# Patient Record
Sex: Male | Born: 1990 | Hispanic: Yes | Smoking: Never smoker
Health system: Southern US, Community
[De-identification: ages and names within clinical notes are randomized; demographics above are authoritative.]

## PROBLEM LIST (undated history)

## (undated) DIAGNOSIS — F419 Anxiety disorder, unspecified: Secondary | ICD-10-CM

## (undated) DIAGNOSIS — I10 Essential (primary) hypertension: Secondary | ICD-10-CM

## (undated) DIAGNOSIS — G43909 Migraine, unspecified, not intractable, without status migrainosus: Secondary | ICD-10-CM

## (undated) HISTORY — DX: Essential (primary) hypertension: I10

---

## 2013-01-29 ENCOUNTER — Encounter (HOSPITAL_COMMUNITY): Payer: Self-pay | Admitting: Emergency Medicine

## 2013-01-29 ENCOUNTER — Emergency Department (HOSPITAL_COMMUNITY)
Admission: EM | Admit: 2013-01-29 | Discharge: 2013-01-29 | Disposition: A | Discharge status: Home / Self Care | Payer: Self-pay | Attending: Emergency Medicine | Admitting: Emergency Medicine

## 2013-01-29 DIAGNOSIS — R42 Dizziness and giddiness: Secondary | ICD-10-CM | POA: Insufficient documentation

## 2013-01-29 LAB — URINALYSIS, ROUTINE W REFLEX MICROSCOPIC
Glucose, UA: NEGATIVE mg/dL
Hgb urine dipstick: NEGATIVE
Ketones, ur: NEGATIVE mg/dL
Leukocytes, UA: NEGATIVE
Protein, ur: NEGATIVE mg/dL
Specific Gravity, Urine: 1.011 (ref 1.005–1.030)
pH: 8 (ref 5.0–8.0)

## 2013-01-29 LAB — CBC WITH DIFFERENTIAL/PLATELET
Basophils Absolute: 0 10*3/uL (ref 0.0–0.1)
Basophils Relative: 0 % (ref 0–1)
Eosinophils Absolute: 0 10*3/uL (ref 0.0–0.7)
Eosinophils Relative: 0 % (ref 0–5)
HCT: 46.4 % (ref 39.0–52.0)
MCHC: 35.3 g/dL (ref 30.0–36.0)
MCV: 85.9 fL (ref 78.0–100.0)
Monocytes Absolute: 0.6 10*3/uL (ref 0.1–1.0)
RDW: 13 % (ref 11.5–15.5)

## 2013-01-29 LAB — COMPREHENSIVE METABOLIC PANEL
ALT: 25 U/L (ref 0–53)
AST: 24 U/L (ref 0–37)
Albumin: 4.3 g/dL (ref 3.5–5.2)
Alkaline Phosphatase: 79 U/L (ref 39–117)
Calcium: 10.2 mg/dL (ref 8.4–10.5)
Creatinine, Ser: 0.69 mg/dL (ref 0.50–1.35)
GFR calc non Af Amer: >90 mL/min (ref >90)
Potassium: 3.5 mEq/L (ref 3.5–5.1)
Total Protein: 7.8 g/dL (ref 6.0–8.3)

## 2013-01-29 MED ORDER — MECLIZINE HCL 50 MG PO TABS: 50.0000 mg | ORAL_TABLET | Freq: Three times a day (TID) | ORAL | Status: DC | PRN

## 2013-01-29 MED ORDER — ONDANSETRON 4 MG PO TBDP: 4.0000 mg | ORAL_TABLET | Freq: Three times a day (TID) | ORAL | Status: DC | PRN

## 2013-01-29 NOTE — ED Notes (Signed)
Pt c/o vomiting and right arm numbness x 2 days; pt denies pain; pt speaks no english

## 2013-01-29 NOTE — ED Notes (Signed)
Pt comfortable with d/c and f/u instructions. Interpreter phone used to discuss discharge and f/u instructions. Prescriptions x2.

## 2013-01-29 NOTE — ED Provider Notes (Signed)
CSN: [id number]     Arrival date & time 01/29/13  1059 History   First MD Initiated Contact with Patient 01/29/13 1205     Chief Complaint  Patient presents with  . Emesis   (Consider location/radiation/quality/duration/timing/severity/associated sxs/prior Treatment) HPI Comments: Patient is a 22 year old male with no past medical history who presents with a 2 day history of dizziness. Symptoms started gradually and remained intermittent. The dizziness is room spinning and worse with head movement. Patient reports recovering from an ear infection 1 week ago. Patient reports associated nausea and vomiting with the dizziness. Patient did not try anything for symptoms.    History reviewed. No pertinent past medical history. History reviewed. No pertinent past surgical history. History reviewed. No pertinent family history. History  Substance Use Topics  . Smoking status: Never Smoker   . Smokeless tobacco: Not on file  . Alcohol Use: No    Review of Systems  Gastrointestinal: Positive for nausea and vomiting.  Neurological: Positive for dizziness.  All other systems reviewed and are negative.    Allergies  Review of patient's allergies indicates no known allergies.  Home Medications  No current outpatient prescriptions on file. BP 125/64  Pulse 85  Temp(Src) 98.8 F (37.1 C) (Oral)  Resp 18  Wt 132 lb 9.6 oz (60.147 kg)  SpO2 100% Physical Exam  Nursing note and vitals reviewed. Constitutional: He is oriented to person, place, and time. He appears well-developed and well-nourished. No distress.  HENT:  Head: Normocephalic and atraumatic.  Right Ear: External ear normal.  Left Ear: External ear normal.  Nose: Nose normal.  Mouth/Throat: Oropharynx is clear and moist. No oropharyngeal exudate.  Bilateral TM visualized and intact without erythema or retraction noted.   Eyes: Conjunctivae and EOM are normal. Pupils are equal, round, and reactive to light. No scleral  icterus.  Neck: Normal range of motion.  Cardiovascular: Normal rate and regular rhythm.  Exam reveals no gallop and no friction rub.   No murmur heard. Pulmonary/Chest: Effort normal and breath sounds normal. He has no wheezes. He has no rales. He exhibits no tenderness.  Musculoskeletal: Normal range of motion.  Neurological: He is alert and oriented to person, place, and time. Coordination normal.  Speech is goal-oriented. Moves limbs without ataxia.   Skin: Skin is warm and dry.  Psychiatric: He has a normal mood and affect. His behavior is normal.    ED Course  Procedures (including critical care time) Labs Review Labs Reviewed  COMPREHENSIVE METABOLIC PANEL - Abnormal; Notable for the following:    Glucose, Bld 124 (*)    All other components within normal limits  URINALYSIS, ROUTINE W REFLEX MICROSCOPIC - Abnormal; Notable for the following:    APPearance CLOUDY (*)    All other components within normal limits  URINE CULTURE  CBC WITH DIFFERENTIAL  LIPASE, BLOOD   Imaging Review No results found.  EKG Interpretation   None       MDM   1. Vertigo     1:43 PM Patient will be treated for vertigo with Meclizine and zofran for nausea. Vitals stable and patient afebrile. Labs and urinalysis unremarkable for acute changes. No further evaluation needed at this time. Plan discussed with patient via nurse who speaks spanish fluently.     Emilia Beck, PA-C 01/29/13 1406

## 2013-01-30 LAB — URINE CULTURE

## 2013-01-30 NOTE — ED Provider Notes (Signed)
Medical screening examination/treatment/procedure(s) were performed by non-physician practitioner and as supervising physician I was immediately available for consultation/collaboration.   Deundra Furber M Avory Mimbs, DO 01/30/13 2204 

## 2013-09-09 ENCOUNTER — Encounter (HOSPITAL_COMMUNITY): Payer: Self-pay | Admitting: Emergency Medicine

## 2013-09-09 ENCOUNTER — Emergency Department (HOSPITAL_COMMUNITY)
Admission: EM | Admit: 2013-09-09 | Discharge: 2013-09-09 | Discharge status: Against Medical Advice | Payer: Self-pay | Attending: Emergency Medicine | Admitting: Emergency Medicine

## 2013-09-09 DIAGNOSIS — R51 Headache: Secondary | ICD-10-CM | POA: Insufficient documentation

## 2013-09-09 DIAGNOSIS — F17200 Nicotine dependence, unspecified, uncomplicated: Secondary | ICD-10-CM | POA: Insufficient documentation

## 2013-09-09 NOTE — ED Notes (Signed)
Pt c/o headache x 1 year that has been intermittent. 5/ 10 pain. Denies n/v.

## 2013-09-09 NOTE — ED Notes (Signed)
Pt ripped off arm bracelet and walked out and left.

## 2013-09-11 ENCOUNTER — Encounter (HOSPITAL_COMMUNITY): Payer: Self-pay | Admitting: Emergency Medicine

## 2013-09-11 ENCOUNTER — Emergency Department (HOSPITAL_COMMUNITY): Payer: Self-pay

## 2013-09-11 ENCOUNTER — Emergency Department (HOSPITAL_COMMUNITY)
Admission: EM | Admit: 2013-09-11 | Discharge: 2013-09-11 | Disposition: A | Discharge status: Home / Self Care | Payer: Self-pay | Attending: Emergency Medicine | Admitting: Emergency Medicine

## 2013-09-11 DIAGNOSIS — R42 Dizziness and giddiness: Secondary | ICD-10-CM | POA: Insufficient documentation

## 2013-09-11 DIAGNOSIS — H538 Other visual disturbances: Secondary | ICD-10-CM | POA: Insufficient documentation

## 2013-09-11 DIAGNOSIS — R519 Headache, unspecified: Secondary | ICD-10-CM

## 2013-09-11 DIAGNOSIS — R51 Headache: Secondary | ICD-10-CM | POA: Insufficient documentation

## 2013-09-11 MED ORDER — ASPIRIN-ACETAMINOPHEN-CAFFEINE 250-250-65 MG PO TABS: 1.0000 | ORAL_TABLET | Freq: Four times a day (QID) | ORAL | Status: DC | PRN

## 2013-09-11 MED ORDER — MECLIZINE HCL 25 MG PO TABS
25.0000 mg | ORAL_TABLET | Freq: Once | ORAL | Status: AC
  Administered 2013-09-11: 25 mg via ORAL
  Filled 2013-09-11: qty 1

## 2013-09-11 MED ORDER — DIPHENHYDRAMINE HCL 50 MG/ML IJ SOLN
25.0000 mg | Freq: Once | INTRAMUSCULAR | Status: AC
  Administered 2013-09-11: 25 mg via INTRAMUSCULAR
  Filled 2013-09-11: qty 1

## 2013-09-11 MED ORDER — METOCLOPRAMIDE HCL 5 MG/ML IJ SOLN
10.0000 mg | Freq: Once | INTRAMUSCULAR | Status: AC
  Administered 2013-09-11: 10 mg via INTRAMUSCULAR
  Filled 2013-09-11: qty 2

## 2013-09-11 NOTE — ED Notes (Signed)
Pt escorted to discharge window. Pt verbalized understanding discharge instructions. In no acute distress.  

## 2013-09-11 NOTE — Discharge Instructions (Signed)
Please follow up with your primary care physician in 1-2 days. If you do not have one please call the [facility] and wellness Center number listed above. Please follow up with [facility] to schedule a follow up appointment. Please take medications as prescribed. Please read all discharge instructions and return precautions.    Dolor de Training and development officer, preguntas frecuentes y sus respuestas (Headaches, Frequently Asked Questions) CEFALEAS MIGRAOSAS P: Qu es la migraa? Qu la ocasiona? Cmo puedo tratarla? R: En general, la migraa comienza como un dolor apagado. Luego progresa hacia un dolor, constante, punzante y como un latido. Sentir Scientist, physiological las sienes. Podr sentir Radiographer, therapeutic parte anterior o posterior de la cabeza, o en uno o ambos lados. El dolor suele estar acompaado de una combinacin de:  Nuseas.  Vmitos.  Sensibilidad a la luz y los ruidos. Algunas personas (un 15%) experimentan un aura (ver abajo) antes de un ataque. La causa de la migraa se debe a reacciones qumicas del cerebro. El tratamiento para la migraa puede incluir medicamentos de [place]. Tambin puede incluir tcnicas de [place]. Estas incluyen entrenamientos para la relajacin y biorretroalimentacin.  P: Qu es un aura? R: Alrededor del 15% de las personas con migraa tiene un "aura". Es una seal de sntomas neurolgicos que ocurren antes de un dolor de cabeza por migraas. Podr ver lneas onduladas o irregulares, puntos o luces parpadeantes. Podr experimentar visin de tnel o puntos ciegos en uno o ambos ojos. El aura puede incluir alucinaciones visuales o auditivas (algo que se imagina). Puede incluir trastornos en el olfato (como olores extraos), el tacto o el gusto. Entre otros sntomas se incluyen:  Adormecimiento.  Sensacin de hormigueo.  Dificultad para recordar o Occupational hygienist. Estos episodios neurolgicos pueden durar hasta 60 minutos. Los sntomas desaparecern a  medida que el dolor de cabeza comience. P:Qu es un disparador? R: Ciertos factores fsicos o Sports administrator a "disparar" una migraa. Estos son:  Alimentos.  Cambios hormonales.  Clima.  Estrs. Es importante recordar que los disparadores son diferentes entre si. Para ayudar a prevenir ataques de migraas, necesitar descubrir cules son los Psychologist, prison and probation services. Lleve un diario sobre sus dolores de [place]. Este es un buen modo para descubrir los disparadores. El Sales executive en el momento de hablar con el profesional acerca de su enfermedad. P: El clima afecta en las migraas? R: La luz solar, el calor, la humedad y lo cambios drsticos en la presin Software engineer a, o "disparar" un ataque de migraa en [employer]. Pero estudios han demostrado que el clima no acta como disparador para todas las personas con [place]. P: Cul es la relacin entre la migraa y la hormonas? R: Las hormonas inician y regulan muchas de las funciones corporales. Las hormonas [employer] balance en el cuerpo dentro de los constantes cambios de [place]. Algunas veces, el nivel de hormonas en el cuerpo se desbalancea. Por ejemplo, durante la menstruacin, el embarazo o la [place]. Pueden ser la causa de un ataque de migraa. De hecho, alrededor de tres cuartos de las mujeres con migraa informan que sus ataques estn relacionados con el ciclo menstrual.  P: Aumenta el riesgo de sufrir un choque cardaco en las personas que padecen migraa? R: La probabilidad de que un ataque de migraa ocasione un ataque cardaco es muy remota. Esto no quiere [employer] persona que sufre de migraa no pueda tener un ataque cardaco asociado con ella. En las  personas menores de 40 aos, el factor ms comn para un ataque es la [place]. Pero durante la vida de una persona, la ocurrencia de un dolor de cabeza por migraa est asociada con una reduccin en el riesgo de morir por un ataque  cerebrovascular.  P: Cules son los medicamentos para la migraa? R: La medicacin precisa se [place] para tratar el dolor de cabeza una vez que ha comenzado. Son ejemplos, medicamentos de [place], desinflamatorios sin esteroides, ergotamnicos y triptanos.  P: Qu son los triptanos? R: Lo triptanos son Yaakov Guthrie clase de medicamentos abortivos. Son especficos para tratar este problema. Los triptanos son vasoconstrictores. Moderan algunas reacciones qumicas del cerebro. Los triptanos trabajan como receptores del cerebro. Ayudan a Warehouse manager de un neurotransmisor denominado serotonina. Se cree que las fluctuaciones en los [place] de serotonina son la causa principal de la migraa.  P: Son [employer] de venta libre para la migraa? R: Los medicamentos de [employer] pueden ser efectivos para Paramedic dolores leves a moderados y los sntomas asociados a la [place]. Pero deber consultar a un mdico antes de Charity fundraiser tratamiento para la migraa.  P: Cules son los medicamentos de prevencin de la migraa? R: Se suele denominar tratamiento "profilctico" a los medicamentos para la prevencin de la migraa. Se utilizan para reducir Print production planner, gravedad y duracin de los ataques de [place]. Son ejemplos de medicamentos de prevencin: antiepilpticos, antidepresivos, bloqueadores beta, bloqueadores de los canales de calcio y medicamentos antiinflamatorios sin esteroides. P:  Por qu se utilizan anticonvulsivantes para tratar la migraa? R: Durante los ltimos aos, ha habido un creciente inters en las drogas antiepilpticas para la prevencin de la migraa. A menudo se los conoce como "anticonvulsivantes". La epilepsia y la migraa suceden por reacciones similares en el cerebro.  P:  Por qu se utilizan antidepresivos para tratar la migraa? R: Los antidepresivos tpicamente se [place] para tratar a las personas con depresin. Pueden reducir la frecuencia de la  migraa a travs de la regulacin de los niveles qumicos, como la serotonina, en el cerebro.  P:  Por qu se utilizan terapias alternativas para tratar la migraa? R: El trmino "terapias alternativas" suelen utilizarse para describir los tratamientos que se considera que estn por fuera de Occupational hygienist la medicina occidental convencional. Son ejemplos de las terapias alternativas: la acupuntura, la acupresin y el yoga. Otra terapia alternativa comn es la terapia herbal. Se cree que algunas hierbas ayudan a [employer] de [place]. Siempre consulte con [employer] acerca de las terapias alternativas antes de [place]. Algunos productos herbales contienen arsnico y [place] toxinas. DOLORES DE CABEZA POR TENSIN P: Qu es un dolor de cabeza por tensin? Qu lo ocasiona? Cmo puedo tratarlo? R: Los dolores de cabeza por tensin ocurren al azar. A menudo son el resultado de estrs temporario, ansiedad, fatiga o ira. Los sntomas Environmental education officer en las sienes, una sensacin como de tener una banda alrededor de la cabeza (un dolor que "presiona"). Los sntomas pueden incluir una sensacin de [place], de presin y Engineer, materials de los msculos de la cabeza y el cuello. El dolor comienza en la frente, sienes o en la parte posterior de la cabeza y el cuello. El tratamiento para los dolores de cabeza por tensin puede incluir medicamentos de [place]. Tambin puede incluir tcnicas de [place] con entrenamientos para la relajacin y biorretroalimentacin. CEFALEA EN RACIMOS P: Qu es una cefalea en racimos? Qu la ocasiona? Cmo puedo tratarla? R: La cefalea en racimos  toma su nombre debido a que los ataques vienen en grupos. El dolor aparece con poco o ningn aviso. Normalmente ocurre de un lado de la cabeza. Muchas veces el dolor viene acompaado de un lagrimeo u ojo rojo y goteo de la nariz del mismo lado que Chief Technology Officer. Se cree que la causa es una reaccin en las sustancias qumicas del cerebro. Se  describe como el caso ms grave e intenso de cualquier tipo de dolor de cabeza. El tratamiento incluye medicamentos bajo receta y oxgeno. CEFALEA SINUSAL P: Qu es una cefalea sinusal? Qu la ocasiona? Cmo puedo tratarla? R: Cuando se inflama una cavidad en los huesos de la cara y el crneo (sinus) ocasiona un dolor localizado. Esta enfermedad generalmente es el resultado de una reaccin alrgica, un tumor o una infeccin. Si el dolor de cabeza est ocasionado por un bloqueo del sinus, como una infeccin, probablemente tendr [place]. Una imagen de rayos X confirmar el bloqueo del sinus. El tratamiento indicado por el mdico podr incluir antibiticos para la infeccin, y tambin antihistamnicos o descongestivos.  DOLOR DE CABEZA POR EFECTO "REBOTE" P: Qu es un dolor de cabeza por efecto "rebote"? Qu lo ocasiona? Cmo puedo tratarlo? R: Si se toman medicamentos para el dolor de cabeza muy a menudo puede llevar a la enfermedad conocida como "dolor de cabeza por rebote". Un patrn de abuso de medicamentos para el dolor de cabeza supone tomarlos ms de [employer] por semana o en cantidades excesivas. Esto significa ms que lo que indica el envase o el mdico. Con los dolores de cabeza por rebote, los medicamentos no slo dejan de Engineer, materials sino que adems comienzan a Data processing manager dolores de [place]. Los mdicos tratan los dolores de cabeza por rebote mediante la disminucin del medicamento del que se ha abusado. A veces el medico podr sustituir gradualmente por un tipo diferente de tratamiento o medicacin. Dejar de consumirlo podra ser difcil. El abuso regular de un medicamento aumenta el potencial que se produzcan efectos secundarios graves. Consulte con un mdico si utiliza regularmente medicamentos para Chief Technology Officer de cabeza ms de [address] por semana o ms de lo que indica el envase. PREGUNTAS Y RESPUESTAS ADICIONALES P: Qu es la biorretroalimentacin? R: La biorretroalimentacin es un  tratamiento de [place]. La biorretroalimentacin utiliza un equipamiento especial para controlar los movimientos involuntarios del cuerpo y las respuestas fsicas. La biorretroalimentacin controla:  Respiracin.  Pulso.  Latidos cardacos.  Temperatura.  Tensin muscular.  Actividad cerebrales. La biorretroalimentacin le ayudar a mejorar y Production manager sus ejercicios de Microbiologist. Aprender a Chief Operating Officer las respuestas fsicas relacionadas con el estrs. Una vez que se dominan las tcnicas no necesitar ms el equipamiento. P: Son hereditarios los dolores de [place]? R: Segn algunas estimaciones, aproximadamente 28 millones de estadounidenses sufren migraa. Cuatro de cada cinco (80%) informan una historia familiar de migraa. Los investigadores no pueden asegurar si se trata de un problema gentico o Patent examiner. A pesar de esto, un nio tiene 50% de probabilidades de sufrir migraa si uno de sus padres la sufre. El nio tiene un 75% de probabilidades si ambos padres la sufren.  P. Puede un nio tener migraa? R: En el momento de ingresar a la escuela secundaria, la mayora de los jvenes han experimentado algn tipo de cefalea. Algunos abordajes o medicamentos seguros y Sports administrator las cefaleas o detenerlas luego de que han comenzado.  Olin Hauser tipo de especialista debe ver para diagnosticar y tratar una cefalea? R: Comience con  su mdico de cabecera. Huel Coteonverse acerca de su experiencia y abordaje de las cefaleas. Comente los mtodos de clasificacin, diagnstico y [place]viewtratamiento. El profesional decidir si lo derivar a Music therapistun especialista, segn los sntomas u otras enfermedades. El hecho de sufrir diabetes, Environmental consultantalergias, Catering manageretc, puede requerir un abordaje ms complejo. La National Headache Foundation (Fundacin Nacional para las Converseefaleas) Chief Technology Officerproporcionar, a pedido, Physiological scientistuna lista de los mdicos que son miembros de [place]. Document Released: 03/17/2008 Document Revised:  06/27/2011 [facility] Patient Information 2014 [place]ExitCare, [place].

## 2013-09-11 NOTE — ED Notes (Signed)
Pt c/o dizziness and headache for year now that come and go everyday. Pt has not seen a provider before for this issue. Pt states he does have blurred vision and pt states that he has seen a doctor and was prescribed glasses but pt doesn't wear them bc they hurt his eyes.

## 2013-09-11 NOTE — ED Notes (Signed)
Pt alert and oriented x4. Respirations even and unlabored, bilateral symmetrical rise and fall of chest. Skin warm and dry. In no acute distress. Denies needs.   

## 2013-09-11 NOTE — Progress Notes (Signed)
[facility] provided pt with a [facility] Orange Card application to help patient establish primary care.  °

## 2013-09-11 NOTE — ED Provider Notes (Signed)
CSN: [id number]     Arrival date & time 09/11/13  5929 History   First MD Initiated Contact with Patient 09/11/13 0901     Chief Complaint  Patient presents with  . Headache  . Dizziness     (Consider location/radiation/quality/duration/timing/severity/associated sxs/prior Treatment) HPI Comments: Patient is an otherwise healthy 23 yo M presenting to the ED for one year of left sided and posterior headaches with associated dizziness x 1 year. Patient states that the headaches come and go every day for the last year. He states he wakes up every morning with blurred vision that eventually resolves with time and then in the afternoon develops his headaches that he describes as throbbing pressure. Patient states the dizziness is constant. He has not seen a provider for this complaint, aside from an optometrist who prescribed glasses for the patient. Denies any fevers, chills, nausea, vomiting, photophobia, neck stiffness or pain, numbness or weakness.   Patient is a 23 y.o. male presenting with headaches and dizziness.  Headache Associated symptoms: dizziness   Associated symptoms: no fever, no numbness and no seizures   Dizziness Associated symptoms: headaches     History reviewed. No pertinent past medical history. History reviewed. No pertinent past surgical history. No family history on file. History  Substance Use Topics  . Smoking status: Never Smoker   . Smokeless tobacco: Not on file  . Alcohol Use: No    Review of Systems  Constitutional: Negative for fever and chills.  Neurological: Positive for dizziness and headaches. Negative for tremors, seizures, syncope, facial asymmetry, speech difficulty, weakness and numbness.  All other systems reviewed and are negative.     Allergies  Review of patient's allergies indicates no known allergies.  Home Medications   Prior to Admission medications   Medication Sig Start Date End Date Taking? Authorizing Provider  meclizine  (ANTIVERT) 50 MG tablet Take 1 tablet (50 mg total) by mouth 3 (three) times daily as needed. 01/29/13   Kaitlyn Szekalski, PA-C  ondansetron (ZOFRAN ODT) 4 MG disintegrating tablet Take 1 tablet (4 mg total) by mouth every 8 (eight) hours as needed for nausea. 01/29/13   Kaitlyn Szekalski, PA-C   BP 118/61  Pulse 71  Temp(Src) 97.8 F (36.6 C) (Oral)  Resp 16  SpO2 95% Physical Exam  Nursing note and vitals reviewed. Constitutional: He is oriented to person, place, and time. He appears well-developed and well-nourished. No distress.  HENT:  Head: Normocephalic and atraumatic.  Right Ear: Hearing, tympanic membrane, external ear and ear canal normal.  Left Ear: Hearing, tympanic membrane, external ear and ear canal normal.  Nose: Nose normal.  Mouth/Throat: Uvula is midline, oropharynx is clear and moist and mucous membranes are normal. No oropharyngeal exudate.  Eyes: Conjunctivae, EOM and lids are normal. Pupils are equal, round, and reactive to light.  Fundoscopic exam:      The right eye shows no exudate, no hemorrhage and no papilledema. The right eye shows red reflex.       The left eye shows no exudate, no hemorrhage and no papilledema. The left eye shows red reflex.  Neck: Normal range of motion. Neck supple.  Cardiovascular: Normal rate, regular rhythm, normal heart sounds and intact distal pulses.   Pulmonary/Chest: Effort normal and breath sounds normal. No respiratory distress.  Abdominal: Soft. There is no tenderness.  Musculoskeletal: He exhibits no edema.  Moves all extremities without ataxia.    Neurological: He is alert and oriented to person, place, and time. He  has normal strength. He displays no tremor. No cranial nerve deficit. He displays a negative Romberg sign. He displays no seizure activity. Gait normal. GCS eye subscore is 4. GCS verbal subscore is 5. GCS motor subscore is 6.  Sensation grossly intact.  No pronator drift.  Bilateral heel-knee-shin intact.   Skin: Skin is warm and dry. He is not diaphoretic.    ED Course  Procedures (including critical care time) Medications  meclizine (ANTIVERT) tablet 25 mg (25 mg Oral Given 09/11/13 0939)  metoCLOPramide (REGLAN) injection 10 mg (10 mg Intramuscular Given 09/11/13 1056)  diphenhydrAMINE (BENADRYL) injection 25 mg (25 mg Intramuscular Given 09/11/13 1056)    Labs Review Labs Reviewed - No data to display  Imaging Review Ct Head Wo Contrast  09/11/2013   CLINICAL DATA:  Dizziness and headache  EXAM: CT HEAD WITHOUT CONTRAST  TECHNIQUE: Contiguous axial images were obtained from the base of the skull through the vertex without intravenous contrast.  COMPARISON:  None.  FINDINGS: Ventricles are normal in size and configuration. No parenchymal masses or mass effect. No areas of abnormal parenchymal attenuation. No extra-axial masses or abnormal fluid collections. There is no intracranial hemorrhage.  Visualized sinuses and mastoid air cells are clear. No skull lesion.  IMPRESSION: Normal unenhanced CT scan of the brain.   Electronically Signed   By: Amie Portlandavid  Ormond M.D.   On: 09/11/2013 09:52     EKG Interpretation None      MDM   Final diagnoses:  Headache   Filed Vitals:   09/11/13 1137  BP: 118/61  Pulse: 71  Temp: 97.8 F (36.6 C)  Resp: 16    10:22 AM Patient endorses some improvement of his HA and dizziness after Antivert. Discussed negative CT scans with patient and that he will likely need neurology outpatient follow up for further evaluation of chronic headaches.   Afebrile, NAD, non-toxic appearing, AAOx4. Pt HA treated and improved while in ED.  Presentation is like pts typical HA and non concerning for [facility]AH, ICH, Meningitis, or temporal arteritis. Pt is afebrile with no focal neuro deficits, nuchal rigidity, or change in vision. No evidence of papilledema, or other intra ocular abnormality. No evidence of mass on CT scan or other intracranial abnormality. Low clinical  suspicion for subarachnoid hemorrhage, intracranial temporal arteritis. Discussed with patient that he will likely need to follow up with an outpatient neurologist to discuss his chronic headaches. Precautions discussed. Pt verbalizes understanding and is agreeable with plan to dc.     Jeannetta EllisJennifer L Saidy Ormand, PA-C 09/11/13 1504

## 2013-09-12 ENCOUNTER — Emergency Department (HOSPITAL_COMMUNITY)
Admission: EM | Admit: 2013-09-12 | Discharge: 2013-09-12 | Disposition: A | Discharge status: Home / Self Care | Payer: Self-pay | Attending: Emergency Medicine | Admitting: Emergency Medicine

## 2013-09-12 ENCOUNTER — Encounter (HOSPITAL_COMMUNITY): Payer: Self-pay | Admitting: Emergency Medicine

## 2013-09-12 ENCOUNTER — Other Ambulatory Visit: Payer: Self-pay

## 2013-09-12 DIAGNOSIS — Z79899 Other long term (current) drug therapy: Secondary | ICD-10-CM | POA: Insufficient documentation

## 2013-09-12 DIAGNOSIS — F17200 Nicotine dependence, unspecified, uncomplicated: Secondary | ICD-10-CM | POA: Insufficient documentation

## 2013-09-12 DIAGNOSIS — R51 Headache: Secondary | ICD-10-CM | POA: Insufficient documentation

## 2013-09-12 DIAGNOSIS — R42 Dizziness and giddiness: Secondary | ICD-10-CM | POA: Insufficient documentation

## 2013-09-12 DIAGNOSIS — R519 Headache, unspecified: Secondary | ICD-10-CM

## 2013-09-12 LAB — CBC WITH DIFFERENTIAL/PLATELET
Eosinophils Absolute: 0 10*3/uL (ref 0.0–0.7)
Eosinophils Relative: 0 % (ref 0–5)
HCT: 42.7 % (ref 39.0–52.0)
Hemoglobin: 15.2 g/dL (ref 13.0–17.0)
Lymphocytes Relative: 9 % — ABNORMAL LOW (ref 12–46)
Lymphs Abs: 1.4 10*3/uL (ref 0.7–4.0)
MCH: 30.5 pg (ref 26.0–34.0)
MCV: 85.7 fL (ref 78.0–100.0)
Monocytes Absolute: 1.1 10*3/uL — ABNORMAL HIGH (ref 0.1–1.0)
Neutrophils Relative %: 84 % — ABNORMAL HIGH (ref 43–77)
Platelets: 249 10*3/uL (ref 150–400)
RBC: 4.98 MIL/uL (ref 4.22–5.81)

## 2013-09-12 LAB — COMPREHENSIVE METABOLIC PANEL
ALT: 25 U/L (ref 0–53)
BUN: 18 mg/dL (ref 6–23)
CO2: 25 mEq/L (ref 19–32)
Calcium: 9.9 mg/dL (ref 8.4–10.5)
Creatinine, Ser: 0.78 mg/dL (ref 0.50–1.35)
GFR calc Af Amer: >90 mL/min (ref >90)
GFR calc non Af Amer: >90 mL/min (ref >90)
Glucose, Bld: 129 mg/dL — ABNORMAL HIGH (ref 70–99)
Potassium: 4 mEq/L (ref 3.7–5.3)
Sodium: 138 mEq/L (ref 137–147)
Total Protein: 7.4 g/dL (ref 6.0–8.3)

## 2013-09-12 LAB — RAPID URINE DRUG SCREEN, HOSP PERFORMED
Barbiturates: NOT DETECTED
Benzodiazepines: NOT DETECTED
Cocaine: NOT DETECTED
Opiates: NOT DETECTED
Tetrahydrocannabinol: NOT DETECTED

## 2013-09-12 LAB — URINALYSIS, ROUTINE W REFLEX MICROSCOPIC
Glucose, UA: NEGATIVE mg/dL
Leukocytes, UA: NEGATIVE
Nitrite: NEGATIVE
Protein, ur: NEGATIVE mg/dL
Specific Gravity, Urine: 1.01 (ref 1.005–1.030)
pH: 6.5 (ref 5.0–8.0)

## 2013-09-12 LAB — I-STAT TROPONIN, ED

## 2013-09-12 MED ORDER — KETOROLAC TROMETHAMINE 30 MG/ML IJ SOLN
30.0000 mg | Freq: Once | INTRAMUSCULAR | Status: AC
  Administered 2013-09-12: 30 mg via INTRAVENOUS
  Filled 2013-09-12: qty 1

## 2013-09-12 MED ORDER — PROCHLORPERAZINE EDISYLATE 5 MG/ML IJ SOLN
10.0000 mg | Freq: Once | INTRAMUSCULAR | Status: AC
  Administered 2013-09-12: 10 mg via INTRAVENOUS
  Filled 2013-09-12: qty 2

## 2013-09-12 MED ORDER — DIPHENHYDRAMINE HCL 50 MG/ML IJ SOLN
25.0000 mg | Freq: Once | INTRAMUSCULAR | Status: AC
  Administered 2013-09-12: 25 mg via INTRAVENOUS
  Filled 2013-09-12: qty 1

## 2013-09-12 MED ORDER — SODIUM CHLORIDE 0.9 % IV BOLUS (SEPSIS)
1000.0000 mL | Freq: Once | INTRAVENOUS | Status: AC
  Administered 2013-09-12: 1000 mL via INTRAVENOUS

## 2013-09-12 MED ORDER — LORAZEPAM 2 MG/ML IJ SOLN
1.0000 mg | Freq: Once | INTRAMUSCULAR | Status: AC
  Administered 2013-09-12: 1 mg via INTRAVENOUS
  Filled 2013-09-12: qty 1

## 2013-09-12 NOTE — ED Provider Notes (Signed)
Medical screening examination/treatment/procedure(s) were performed by non-physician practitioner and as supervising physician I was immediately available for consultation/collaboration.   EKG Interpretation None       Millard Bautch, MD 09/12/13 0705 

## 2013-09-12 NOTE — ED Notes (Signed)
Burna Mortimer at bedside discussing follow up appt with neurologist.

## 2013-09-12 NOTE — Discharge Instructions (Signed)
Continue taking excedrin for headache.  Stop taking reglan.   Take motrin for headache as well.   See wellness center and [facility].   Return to ER if you have severe headache, worse dizziness, weakness.

## 2013-09-12 NOTE — ED Notes (Signed)
Pt A&OX4, ambulatory at discharge with slow, steady gait, NAD. 

## 2013-09-12 NOTE — ED Provider Notes (Addendum)
CSN: [id number]     Arrival date & time 09/12/13  1502 History   First MD Initiated Contact with Patient 09/12/13 1545     Chief Complaint  Patient presents with  . Headache  . Numbness     (Consider location/radiation/quality/duration/timing/severity/associated sxs/prior Treatment) The history is provided by the patient. A language interpreter was used.  Howard Bender is a 23 y.o. male here with migraines. He has been having intermittent headache over the last year. It started when he used cocaine a year ago. Claims that he hasn't been using cocaine recently. He was seen in the ER yesterday for headache and had a normal CT head. He was given meclizine, Reglan and felt better. He went home and took 2 doses of reglan this morning and then had worse dizziness, tingling sensation to face and arm.    History reviewed. No pertinent past medical history. History reviewed. No pertinent past surgical history. History reviewed. No pertinent family history. History  Substance Use Topics  . Smoking status: Current Every Day Smoker    Types: Cigarettes  . Smokeless tobacco: Not on file  . Alcohol Use: No    Review of Systems  Neurological: Positive for headaches.      Allergies  Review of patient's allergies indicates no known allergies.  Home Medications   Prior to Admission medications   Medication Sig Start Date End Date Taking? Authorizing Provider  amoxicillin (AMOXIL) 500 MG capsule Take 1,000 mg by mouth 2 (two) times daily. Prescribed on 08-19-13 however patient states that he just finished on 09-09-13   Yes Historical Provider, MD  aspirin-acetaminophen-caffeine (EXCEDRIN MIGRAINE) [phone] MG per tablet Take 1 tablet by mouth every 6 (six) hours as needed for headache.   Yes Historical Provider, MD  aspirin-acetaminophen-caffeine (EXCEDRIN MIGRAINE) [phone] MG per tablet Take 2 tablets by mouth every 6 (six) hours as needed for headache.   Yes Historical Provider, MD   clarithromycin (BIAXIN) 500 MG tablet Take 500 mg by mouth 2 (two) times daily. Prescribed 08-19-13 for 10 days however patient finished on 09-09-13   Yes Historical Provider, MD  meclizine (ANTIVERT) 25 MG tablet Take 25 mg by mouth 3 (three) times daily as needed for dizziness.   Yes Historical Provider, MD  omeprazole (PRILOSEC) 20 MG capsule Take 20 mg by mouth daily.   Yes Historical Provider, MD   BP 109/50  Pulse 69  Temp(Src) 98.7 F (37.1 C) (Oral)  Resp 19  SpO2 99% Physical Exam  Nursing note and vitals reviewed. Constitutional: He is oriented to person, place, and time. He appears well-nourished.  Anxious   HENT:  Head: Normocephalic.  Mouth/Throat: Oropharynx is clear and moist.  Eyes: Conjunctivae and EOM are normal. Pupils are equal, round, and reactive to light.  No nystagmus   Neck: Normal range of motion. Neck supple.  Cardiovascular: Normal rate, regular rhythm and normal heart sounds.   Pulmonary/Chest: Effort normal and breath sounds normal. No respiratory distress. He has no wheezes. He has no rales.  Abdominal: Bowel sounds are normal. He exhibits no distension. There is no tenderness. There is no rebound and no guarding.  Musculoskeletal: Normal range of motion.  Neurological: He is alert and oriented to person, place, and time. No cranial nerve deficit. Coordination normal.  Cn 2-12 intact, nl finger to nose. Nl gait. No pronator drift   Skin: Skin is warm and dry.  Psychiatric: He has a normal mood and affect. His behavior is normal. Judgment and thought content normal.  ED Course  Procedures (including critical care time) Labs Review Labs Reviewed  CBC WITH DIFFERENTIAL - Abnormal; Notable for the following:    WBC 15.3 (*)    Neutrophils Relative % 84 (*)    Neutro Abs 12.8 (*)    Lymphocytes Relative 9 (*)    Monocytes Absolute 1.1 (*)    All other components within normal limits  COMPREHENSIVE METABOLIC PANEL - Abnormal; Notable for the  following:    Glucose, Bld 129 (*)    All other components within normal limits  URINE RAPID DRUG SCREEN (HOSP PERFORMED)  URINALYSIS, ROUTINE W REFLEX MICROSCOPIC  I-STAT TROPOININ, ED    Imaging Review No results found.   EKG Interpretation   Date/Time:  Thursday Sep 12 2013 16:12:02 EDT Ventricular Rate:  85 PR Interval:  144 QRS Duration: 88 QT Interval:  328 QTC Calculation: 390 R Axis:   87 Text Interpretation:  Sinus rhythm No significant change since last  tracing Confirmed by Seneca Hoback  MD, Khaiden Segreto ([id number]) on 09/12/2013 4:15:27 PM      Date: 09/12/2013  Rate: 85  Rhythm: normal sinus rhythm  QRS Axis: normal  Intervals: normal  ST/T Wave abnormalities: normal  Conduction Disutrbances:none  Narrative Interpretation:   Old EKG Reviewed: none available    MDM   Final diagnoses:  None   Howard Bender is a 23 y.o. male here with chronic dizziness, headache. CT head yesterday showed no bleed or mass. Tachycardic 140s in triage but was 90s in the room. He appears anxious. I reassured him with the translator phone. I told him that since he is not feeling better with excedrin and reglan, he should see a neurologist. Will give headache cocktail and reassess.   7:50 PM Felt better with headache cocktail. Utox neg for cocaine. Tachycardia improved. Likely migraines. WBC slightly elevated but no signs of sepsis. Recommend continue excedrin and neuro f/u.    Howard Canalavid H Emelly Wurtz, MD 09/12/13 1950  Howard Canalavid H Lynsey Ange, MD 09/12/13 [phone]

## 2013-09-12 NOTE — Progress Notes (Signed)
ED CM consulted by Dr. Darl Householder concerning f/u care.Pt presented to [facility] ED with worsening headache patient was also seen yesterday at [facility] ED for the same complaint. Pt is Spanish speaking only [employer] Mickel Baas [phone]. Met with patient at bedside interpreter on speaker phone. Information confirmed via interpreter. Pt  States, that he does not have a PCP or Medical coverage.  Discussed the [facility] and it's services. Pt states,he is agreeable. [facility] brochure given with address and phone number. Explained that I would have a Spanish scheduler contact patient Friday 5/29, or Monday 6/1 to schedule appt for f/u at the [facility].

## 2013-09-12 NOTE — ED Notes (Signed)
Used translator phone: Pt very anxious at triage, reports having migraines x 1 year, was started on a new medication yesterday for migraines but doesn't know the name of it. Reports having a headache since one year ago when he was doing large amounts of cocaine. Took the medication today and began feeling "funny" having anxiety, tingling sensation to face and arms, tightening and contractures to fingers. Instructed pt to slow his breathing down. HR 140 at triage, ekg done.

## 2013-09-12 NOTE — ED Notes (Signed)
[id number] interpreter ID number - discharge instructions reviewed via interpreter phone.

## 2013-09-13 ENCOUNTER — Encounter (HOSPITAL_COMMUNITY): Payer: Self-pay | Admitting: Emergency Medicine

## 2013-10-04 ENCOUNTER — Ambulatory Visit: Payer: Self-pay

## 2013-11-27 ENCOUNTER — Encounter: Payer: Self-pay | Admitting: Neurology

## 2013-11-27 ENCOUNTER — Ambulatory Visit (INDEPENDENT_AMBULATORY_CARE_PROVIDER_SITE_OTHER): Payer: Self-pay | Admitting: Neurology

## 2013-11-27 VITALS — BP 120/84 | HR 87 | Ht 62.99 in | Wt 127.1 lb

## 2013-11-27 DIAGNOSIS — R51 Headache: Secondary | ICD-10-CM

## 2013-11-27 MED ORDER — NORTRIPTYLINE HCL 10 MG PO CAPS: ORAL_CAPSULE | ORAL | Status: DC

## 2013-11-27 NOTE — Patient Instructions (Signed)
1. Start nortriptyline 10mg : Take 1 tablet at bedtime for 1 week, then increase to 2 tablets at bedtime and continue 2. Follow-up in 2 months

## 2013-11-27 NOTE — Progress Notes (Signed)
NEUROLOGY CONSULTATION NOTE  Howard Bender MRN: [id number] DOB: [birth year]  Referring provider: Dr. Terrace Arabia Primary care provider: Dr. Terrace Arabia  Reason for consult:  Eye pain, dizziness, vision changes  Dear Dr Lonzo Cloud:  Thank you for your kind referral of Howard Bender for consultation of the above symptoms. Although his history is well known to you, please allow me to reiterate it for the purpose of our medical record. A Spanish medical interpreter was present to help with translation. The patient was accompanied to the clinic by his friend who also provides collateral information. Records and images were personally reviewed where available.  HISTORY OF PRESENT ILLNESS: This is a pleasant 23 year old right-handed Hispanic man with no significant past medical history in his usually state of health until the past year when he started having left temporal, bilateral retro-orbital and periorbital (above the eyelids) sharp pain with associated photosensitivity.  He reports that pain usually occurs when he walks around, he feels better sitting down and after eating.  He feels that symptoms started when he began using his cellphone, with his head bent forward. Extending his head back seemed to help.  He reports pain occurs daily, with rarely a day without pain. 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 When the pain first started, he was having associated nausea, which was relieved with Zofran. He has stopped taking this.  He also started having dizziness described as a brief spinning sensation with head movement, resolving when head is still.  He also had black floaters in his vision.  He had seen an optometrist twice and was told vision was okay. He had seen ENT, evaluation unremarkable, and symptoms felt to be due to migraine. He took 2 tablets of Excedrin migraine one time, however had side  effects of whole body numbness, leading to an visit to [facility] ER last 08/2013 where head CT done which I personally reviewed was unremarkable.  He denies any focal numbness/tingling/weaknss, no diplopia, dysarthria, dysphagia, neck/back pain, bowel/bladder dysfunction.  He denies any ear pain or tinnitus.  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 There is no family history of headaches.  Laboratory Data: Lab Results  Component Value Date   WBC 8.5 01/29/2013   HGB 16.4 01/29/2013   HCT 46.4 01/29/2013   MCV 85.9 01/29/2013   PLT 276 01/29/2013     Chemistry      Component Value Date/Time   NA 136 01/29/2013 1119   K 3.5 01/29/2013 1119   CL 99 01/29/2013 1119   CO2 24 01/29/2013 1119   BUN 11 01/29/2013 1119   CREATININE 0.69 01/29/2013 1119      Component Value Date/Time   CALCIUM 10.2 01/29/2013 1119   ALKPHOS 79 01/29/2013 1119   AST 24 01/29/2013 1119   ALT 25 01/29/2013 1119   BILITOT 0.8 01/29/2013 1119      PAST MEDICAL HISTORY: No past medical history on file.  PAST SURGICAL HISTORY: No past surgical history on file.  MEDICATIONS: Current Outpatient Prescriptions on File Prior to Visit  Medication Sig Dispense Refill  . aspirin-acetaminophen-caffeine (EXCEDRIN MIGRAINE) 250-250-65 MG per tablet Take 1 tablet by mouth every 6 (six) hours as needed for headache.  30 tablet  0  . loratadine (CLARITIN) 10 MG tablet Take 10 mg by mouth daily.      Marland Kitchen  meclizine (ANTIVERT) 25 MG tablet Take 25 mg by mouth 2 (two) times daily.      Marland Kitchen omeprazole (PRILOSEC) 20 MG capsule Take 20 mg by mouth 2 (two) times daily.      . ondansetron (ZOFRAN ODT) 4 MG disintegrating tablet Take 1 tablet (4 mg total) by mouth every 8 (eight) hours as needed for nausea.  10 tablet  0   No current facility-administered medications on file prior to visit.    ALLERGIES: No Known Allergies  FAMILY HISTORY: Family History  Problem Relation Age of Onset  .  Diabetes Mother   . High blood pressure Mother   . Cancer Maternal Grandmother   . Prostate cancer Maternal Grandfather     SOCIAL HISTORY: History   Social History  . Marital Status: Married    Spouse Name: N/A    Number of Children: 1  . Years of Education: N/A   Occupational History  . cook    Social History Main Topics  . Smoking status: Never Smoker   . Smokeless tobacco: Never Used  . Alcohol Use: No  . Drug Use: No  . Sexual Activity: Not on file   Other Topics Concern  . Not on file   Social History Narrative   ** Merged History Encounter **        REVIEW OF SYSTEMS: Constitutional: No fevers, chills, or sweats, no generalized fatigue, change in appetite Eyes: as above Ear, nose and throat: No hearing loss, ear pain, nasal congestion, sore throat Cardiovascular: No chest pain, palpitations Respiratory:  No shortness of breath at rest or with exertion, wheezes GastrointestinaI: No nausea, vomiting, diarrhea, abdominal pain, fecal incontinence Genitourinary:  No dysuria, urinary retention or frequency Musculoskeletal:  No neck pain, back pain Integumentary: No rash, pruritus, skin lesions Neurological: as above Psychiatric: No depression, insomnia, anxiety Endocrine: No palpitations, fatigue, diaphoresis, mood swings, change in appetite, change in weight, increased thirst Hematologic/Lymphatic:  No anemia, purpura, petechiae. Allergic/Immunologic: no itchy/runny eyes, nasal congestion, recent allergic reactions, rashes  PHYSICAL EXAM: Filed Vitals:   11/27/13 1325  BP: 120/84  Pulse: 87   General: No acute distress Head:  Normocephalic/atraumatic Eyes: Fundoscopic exam shows bilateral sharp discs, no vessel changes, exudates, or hemorrhages Neck: supple, no paraspinal tenderness, full range of motion Back: No paraspinal tenderness Heart: regular rate and rhythm Lungs: Clear to auscultation bilaterally. Vascular: No carotid bruits. Skin/Extremities:  No rash, no edema Neurological Exam: Mental status: alert and oriented to person, place, and time, no dysarthria or aphasia, Fund of knowledge is appropriate.  Recent and remote memory are intact.  Attention and concentration are normal.    Able to name objects and repeat phrases. Cranial nerves: CN I: not tested CN II: pupils equal, round and reactive to light, visual fields intact, fundi unremarkable. CN III, IV, VI:  full range of motion, no nystagmus, no ptosis CN V: facial sensation intact CN VII: upper and lower face symmetric CN VIII: hearing intact to finger rub CN IX, X: gag intact, uvula midline CN XI: sternocleidomastoid and trapezius muscles intact CN XII: tongue midline Bulk & Tone: normal, no fasciculations. Motor: 5/5 throughout with no pronator drift. Sensation: intact to light touch, cold, pin, vibration and joint position sense.  No extinction to double simultaneous stimulation.  Romberg test negative Deep Tendon Reflexes: +2 throughout, no ankle clonus Plantar responses: downgoing bilaterally Cerebellar: no incoordination on finger to nose, heel to shin. No dysdiadochokinesia Gait: narrow-based and steady, able to tandem walk adequately.  Tremor: none  IMPRESSION: This is a pleasant 23 year old right-handed man with a history of chronic daily headaches for the past year.  Headaches are over the orbital and left temporal regions, with some photosensitivity and initially nausea, as well as positional dizziness.  His neurological exam and head CT without contrast are normal.  There are some migrainous features to his headaches, he will start a daily headache prophylactic medication, nortriptyline to hopefully reduce the frequency and intensity of headaches. He does not take any over the counter medications. Side effects of nortriptyline were discussed, he will start on low dose with uptitration as tolerated.  He will follow-up in 2 months and knows to call our office for any  problems.  Thank you for allowing me to participate in the care of this patient. Please do not hesitate to call for any questions or concerns.   Patrcia DollyKaren Aquino, M.D.  CC: Dr. Lonzo CloudShamleffer

## 2013-11-28 ENCOUNTER — Encounter (HOSPITAL_COMMUNITY): Payer: Self-pay | Admitting: Emergency Medicine

## 2013-12-01 ENCOUNTER — Encounter: Payer: Self-pay | Admitting: Neurology

## 2013-12-01 DIAGNOSIS — R51 Headache: Secondary | ICD-10-CM | POA: Insufficient documentation

## 2013-12-01 DIAGNOSIS — R519 Headache, unspecified: Secondary | ICD-10-CM | POA: Insufficient documentation

## 2014-01-27 ENCOUNTER — Ambulatory Visit (INDEPENDENT_AMBULATORY_CARE_PROVIDER_SITE_OTHER): Payer: Self-pay | Admitting: Neurology

## 2014-01-27 ENCOUNTER — Encounter: Payer: Self-pay | Admitting: Neurology

## 2014-01-27 VITALS — BP 122/66 | HR 80 | Ht 63.5 in | Wt 129.0 lb

## 2014-01-27 DIAGNOSIS — G43009 Migraine without aura, not intractable, without status migrainosus: Secondary | ICD-10-CM

## 2014-01-27 MED ORDER — NORTRIPTYLINE HCL 10 MG PO CAPS
ORAL_CAPSULE | ORAL | Status: DC
Start: 2014-01-27 — End: 2014-04-03

## 2014-01-27 NOTE — Progress Notes (Signed)
NEUROLOGY FOLLOW UP OFFICE NOTE  Howard Bender [id number]  HISTORY OF PRESENT ILLNESS: I had the pleasure of seeing Howard Bender in follow-up in the neurology clinic on 01/27/2014.  The patient was last seen 2 months ago and is again accompanied by his friend today. Spanish language line was used for translation. On his last visit, he was reporting headaches occurring on a daily basis. Head CT unremarkable. He started nortriptyline, currently at 20mg  qhs with significant improvement in headaches. He states he feels a lot better, his vision is no longer blurry. On his initial visit, pain was a 10/10, now down to a 3 or 4, and only occurring if he watches TV, uses his phone, or stays out in the sun for prolonged periods. He would wake up with a stabbing pain that decreases after 3-4 days of reducing TV time. There is no nausea, vomiting, focal symptoms. Headaches do not affect work. He is tolerating nortriptyline 20mg  qhs with no side effects.  HPI:  This is a pleasant 23 yo RH man with no significant past medical history in his usually state of health until the past year when he started having left temporal, bilateral retro-orbital and periorbital (above the eyelids) sharp pain with associated photosensitivity. He reports that pain usually occurs when he walks around, he feels better sitting down and after eating. He feels that symptoms started when he began using his cellphone, with his head bent forward. Extending his head back seemed to help. He reported pain occurs daily, with rarely a day without pain. 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 When the pain first started, he was having associated nausea, which was relieved with Zofran. He has stopped taking this. He also started having dizziness described as a brief spinning sensation with head movement, resolving when head is still. He also had black floaters in his  vision. He had seen an optometrist twice and was told vision was okay. He had seen ENT, evaluation unremarkable, and symptoms felt to be due to migraine. He took 2 tablets of Excedrin migraine one time, however had side effects of whole body numbness, leading to an [facility] last 08/2013 where head CT done which I personally reviewed was unremarkable.   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 There is no family history of headaches.  PAST MEDICAL HISTORY: No past medical history on file.  MEDICATIONS: Current Outpatient Prescriptions on File Prior to Visit  Medication Sig Dispense Refill  .      .      .      . Nortriptyline 10mg    2 capsules at bedtime  60 capsule  3  .      .       No current facility-administered medications on file prior to visit.    ALLERGIES: No Known Allergies  FAMILY HISTORY: Family History  Problem Relation Age of Onset  . Diabetes Mother   . High blood pressure Mother   . Cancer Maternal Grandmother   . Prostate cancer Maternal Grandfather     SOCIAL HISTORY: History   Social History  . Marital Status: Married    Spouse Name: N/A    Number of Children: 1  . Years of Education: N/A   Occupational History  . cook    Social History Main Topics  . Smoking  status: Never Smoker   . Smokeless tobacco: Never Used  . Alcohol Use: No  . Drug Use: No  . Sexual Activity: Not on file   Other Topics Concern  . Not on file   Social History Narrative   ** Merged History Encounter **        REVIEW OF SYSTEMS: Constitutional: No fevers, chills, or sweats, no generalized fatigue, change in appetite Eyes: No visual changes, double vision, eye pain Ear, nose and throat: No hearing loss, ear pain, nasal congestion, sore throat Cardiovascular: No chest pain, palpitations Respiratory:  No shortness of breath at rest or with exertion, wheezes GastrointestinaI: No nausea, vomiting, diarrhea, abdominal pain,  fecal incontinence Genitourinary:  No dysuria, urinary retention or frequency Musculoskeletal:  No neck pain, back pain Integumentary: No rash, pruritus, skin lesions Neurological: as above Psychiatric: No depression, insomnia, anxiety Endocrine: No palpitations, fatigue, diaphoresis, mood swings, change in appetite, change in weight, increased thirst Hematologic/Lymphatic:  No anemia, purpura, petechiae. Allergic/Immunologic: no itchy/runny eyes, nasal congestion, recent allergic reactions, rashes  PHYSICAL EXAM: Filed Vitals:   01/27/14 0751  BP: 122/66  Pulse: 80   General: No acute distress Head:  Normocephalic/atraumatic Neck: supple, no paraspinal tenderness, full range of motion Heart:  Regular rate and rhythm Lungs:  Clear to auscultation bilaterally Back: No paraspinal tenderness Skin/Extremities: No rash, no edema Neurological Exam: alert and oriented to person, place, and time. No aphasia or dysarthria. Fund of knowledge is appropriate.  Recent and remote memory are intact.  Attention and concentration are normal.    Able to name objects and repeat phrases. Cranial nerves: Pupils equal, round, reactive to light.  Fundoscopic exam unremarkable, no papilledema. Extraocular movements intact with no nystagmus. Visual fields full. Facial sensation intact. No facial asymmetry. Tongue, uvula, palate midline.  Motor: Bulk and tone normal, muscle strength 5/5 throughout with no pronator drift.  Sensation to light touch, temperature and vibration intact.  No extinction to double simultaneous stimulation.  Deep tendon reflexes 2+ throughout, toes downgoing.  Finger to nose testing intact.  Gait narrow-based and steady, able to tandem walk adequately.  Romberg negative.  IMPRESSION: This is a pleasant 23 yo RH man who presented with chronic daily headaches for the past year. Headaches are over the orbital and left temporal regions,with some migrainous features.  Neurological exam and head CT  normal. He has had good response to nortriptyline 20mg  qhs and will increase dose to 30mg  qhs. He does not take any over the counter medications. He will follow-up in 2-3 months and knows to call our office for any problems.   Thank you for allowing me to participate in his care.  Please do not hesitate to call for any questions or concerns.  The duration of this appointment visit was 15 minutes of face-to-face time with the patient.  Greater than 50% of this time was spent in counseling, explanation of diagnosis, planning of further management, and coordination of care.   Patrcia DollyKaren Aquino, M.D.   CC: Dr. Lonzo CloudShamleffer

## 2014-01-27 NOTE — Patient Instructions (Signed)
1. Increase nortriptyline 10mg : Take 3 caps at bedtime 2. Follow-up in 2 months

## 2014-03-26 ENCOUNTER — Emergency Department (HOSPITAL_COMMUNITY)
Admission: EM | Admit: 2014-03-26 | Discharge: 2014-03-26 | Disposition: A | Discharge status: Home / Self Care | Payer: Self-pay | Attending: Emergency Medicine | Admitting: Emergency Medicine

## 2014-03-26 ENCOUNTER — Emergency Department (HOSPITAL_COMMUNITY)
Admission: EM | Admit: 2014-03-26 | Discharge: 2014-03-27 | Disposition: A | Discharge status: Home / Self Care | Payer: Self-pay | Attending: Emergency Medicine | Admitting: Emergency Medicine

## 2014-03-26 ENCOUNTER — Encounter (HOSPITAL_COMMUNITY): Payer: Self-pay | Admitting: Adult Health

## 2014-03-26 ENCOUNTER — Encounter (HOSPITAL_COMMUNITY): Payer: Self-pay | Admitting: *Deleted

## 2014-03-26 DIAGNOSIS — F419 Anxiety disorder, unspecified: Secondary | ICD-10-CM | POA: Insufficient documentation

## 2014-03-26 DIAGNOSIS — Z79899 Other long term (current) drug therapy: Secondary | ICD-10-CM | POA: Insufficient documentation

## 2014-03-26 DIAGNOSIS — R531 Weakness: Secondary | ICD-10-CM | POA: Insufficient documentation

## 2014-03-26 DIAGNOSIS — R110 Nausea: Secondary | ICD-10-CM | POA: Insufficient documentation

## 2014-03-26 DIAGNOSIS — T43615A Adverse effect of caffeine, initial encounter: Secondary | ICD-10-CM | POA: Insufficient documentation

## 2014-03-26 DIAGNOSIS — R200 Anesthesia of skin: Secondary | ICD-10-CM | POA: Insufficient documentation

## 2014-03-26 LAB — I-STAT CHEM 8, ED
BUN: 13 mg/dL (ref 6–23)
Calcium, Ion: 1.1 mmol/L — ABNORMAL LOW (ref 1.12–1.23)
Chloride: 104 mEq/L (ref 96–112)
Creatinine, Ser: 0.9 mg/dL (ref 0.50–1.35)
Glucose, Bld: 117 mg/dL — ABNORMAL HIGH (ref 70–99)
HCT: 46 % (ref 39.0–52.0)
Hemoglobin: 15.6 g/dL (ref 13.0–17.0)
Potassium: 4.8 mEq/L (ref 3.7–5.3)
Sodium: 140 mEq/L (ref 137–147)
TCO2: 24 mmol/L (ref 0–100)

## 2014-03-26 MED ORDER — SODIUM CHLORIDE 0.9 % IV BOLUS (SEPSIS)
1000.0000 mL | Freq: Once | INTRAVENOUS | Status: AC
  Administered 2014-03-26: 1000 mL via INTRAVENOUS

## 2014-03-26 MED ORDER — DIPHENHYDRAMINE HCL 50 MG/ML IJ SOLN
25.0000 mg | Freq: Once | INTRAMUSCULAR | Status: AC
  Administered 2014-03-26: 25 mg via INTRAVENOUS

## 2014-03-26 MED ORDER — DIPHENHYDRAMINE HCL 50 MG/ML IJ SOLN
INTRAMUSCULAR | Status: AC
  Filled 2014-03-26: qty 1

## 2014-03-26 MED ORDER — LORAZEPAM 2 MG/ML IJ SOLN
1.0000 mg | Freq: Once | INTRAMUSCULAR | Status: AC
Start: 2014-03-26 — End: 2014-03-26
  Administered 2014-03-26: 1 mg via INTRAVENOUS
  Filled 2014-03-26: qty 1

## 2014-03-26 MED ORDER — ONDANSETRON HCL 4 MG/2ML IJ SOLN
4.0000 mg | Freq: Once | INTRAMUSCULAR | Status: AC
  Administered 2014-03-26: 4 mg via INTRAVENOUS
  Filled 2014-03-26: qty 2

## 2014-03-26 MED ORDER — SODIUM CHLORIDE 0.9 % IV BOLUS (SEPSIS)
1000.0000 mL | INTRAVENOUS | Status: AC
  Administered 2014-03-26: 1000 mL via INTRAVENOUS

## 2014-03-26 MED ORDER — PROMETHAZINE HCL 25 MG/ML IJ SOLN
25.0000 mg | Freq: Once | INTRAMUSCULAR | Status: AC
  Administered 2014-03-26: 25 mg via INTRAVENOUS
  Filled 2014-03-26: qty 1

## 2014-03-26 MED ORDER — ONDANSETRON HCL 8 MG PO TABS: 8.0000 mg | ORAL_TABLET | Freq: Three times a day (TID) | ORAL | Status: DC | PRN

## 2014-03-26 NOTE — ED Notes (Signed)
Rn pushed phenergan over 3 min. Pt endorses palpitations and HR is 140-150bpm.

## 2014-03-26 NOTE — ED Notes (Signed)
Third time here today-given RX for zofran last time, now pt reports that he feels nervous and his fingers and toes are numb after taking zofran also c/o nausea still. Denies pain. Denies vomiting, he has drank 2 electrolyte enhanced drinks. Spanish speaking only.

## 2014-03-26 NOTE — ED Provider Notes (Signed)
CSN: [id number]     Arrival date & time 03/26/14  1521 History  This chart was scribed for non-physician practitioner, Allen DerryMercedes Camprubi-Soms, PA-C working with Mirian MoMatthew Gentry, MD by Gwenyth Oberatherine Macek, ED scribe. This patient was seen in room TR06C/TR06C and the patient's care was started at 4:29 PM  Chief Complaint  Patient presents with  . Nausea   The history is provided by the patient. A language interpreter was used (provider).    HPI Comments: Howard Bender is a 23 y.o. male with a PMHx of anxiety, who presents to the Emergency Department complaining of intermittent nausea and weaknessthat started earlier today after he consumed an energy drink yesterday. Pt states he has an adverse reaction to caffeine and that it causes him to feel anxious and shake. Pt was seen in the ED earlier today for the same symptoms at which time he was given IVFs and IV zofran which helped his symptoms. When he went to work, he ate a sandwich and again felt nauseated and weak in his legs. He states symptoms are resolved at this time. Patient states that he is here to have a prescription for an antinausea medication, given that he has not had an appetite therefore he has not eaten, which is contributing to his symptoms. He was not given any medications at discharge earlier today. He denies any fevers, chills, abdomen pain, vomiting, diarrhea, constipation, or cramps. Denies any urinary changes. Reports at this time he feels "okay". Of note, he also states that he is taking nortriptyline for a "problem in his head" that has caused him to have HA, vision "pain", and dizziness. He is under the care of Dr. Karel JarvisAquino for this, and states that at this time his dizziness is ongoing but unchanged, and denies HA or vision changes.   History reviewed. No pertinent past medical history. History reviewed. No pertinent past surgical history. Family History  Problem Relation Age of Onset  . Diabetes Mother   . High blood pressure  Mother   . Cancer Maternal Grandmother   . Prostate cancer Maternal Grandfather    History  Substance Use Topics  . Smoking status: Never Smoker   . Smokeless tobacco: Never Used  . Alcohol Use: No    Review of Systems  Constitutional: Negative for fever and chills.  Gastrointestinal: Positive for nausea. Negative for vomiting, abdominal pain, diarrhea and constipation.  Genitourinary: Negative for dysuria and hematuria.  Musculoskeletal: Negative for myalgias and arthralgias.  Neurological: Positive for weakness. Negative for dizziness (chronic), light-headedness and numbness.  10 Systems reviewed and are negative for acute change except as noted in the HPI.     Allergies  Caffeine  Home Medications   Prior to Admission medications   Medication Sig Start Date End Date Taking? Authorizing Provider  NON FORMULARY Take 1 Dose by mouth once. "ZipFizz" energy powder    Historical Provider, MD  nortriptyline (PAMELOR) 10 MG capsule Take 3 capsules at bedtime 01/27/14   Van ClinesKaren M Aquino, MD   BP 126/60 mmHg  Pulse 84  Temp(Src) 97.9 F (36.6 C) (Oral)  Resp 18  SpO2 100% Physical Exam  Constitutional: Vital signs are normal. He appears well-developed and well-nourished.  Non-toxic appearance. No distress.  Afebrile, nontoxic  HENT:  Head: Normocephalic and atraumatic.  Mouth/Throat: Mucous membranes are normal.  Eyes: Conjunctivae and EOM are normal.  Neck: Normal range of motion. Neck supple.  Cardiovascular: Normal rate.   Pulmonary/Chest: Effort normal. No respiratory distress.  Abdominal: Normal appearance. He  exhibits no distension. There is no tenderness.  Musculoskeletal: Normal range of motion.  Skin: Skin is warm, dry and intact. No rash noted.  Psychiatric: He has a normal mood and affect. His behavior is normal.  Nursing note and vitals reviewed.   ED Course  Procedures (including critical care time) DIAGNOSTIC STUDIES: Oxygen Saturation is 100% on RA,  normal by my interpretation.    COORDINATION OF CARE: 4:09 PM Discussed treatment plan with pt at bedside and pt agreed to plan.    Labs Review Labs Reviewed - No data to display  Imaging Review No results found.   EKG Interpretation None      MDM   Final diagnoses:  Nausea    23 y.o. male here for nausea which resolved. Seen earlier for same. Appears to be adverse reaction to caffeine drink from yesterday and then he hasn't eaten much since then therefore he's feeling weak. This also resolved. Pt without abd pain. Doubt need for further work up. Discussed staying hydrated and eating bland foods as this may be some slight gastritis from irritation from the caffeine drink. Discussed use of zofran for nausea. Stressed importance of seeing a PCP, will give referral to [facility]. I explained the diagnosis and have given explicit precautions to return to the ER including for any other new or worsening symptoms. The patient understands and accepts the medical plan as it's been dictated and I have answered their questions. Discharge instructions concerning home care and prescriptions have been given. The patient is STABLE and is discharged to home in good condition.  I personally performed the services described in this documentation, which was scribed in my presence. The recorded information has been reviewed and is accurate.  BP 126/60 mmHg  Pulse 84  Temp(Src) 97.9 F (36.6 C) (Oral)  Resp 18  SpO2 100%  Meds ordered this encounter  Medications  . ondansetron (ZOFRAN) 8 MG tablet    Sig: Take 1 tablet (8 mg total) by mouth every 8 (eight) hours as needed for nausea or vomiting.    Dispense:  10 tablet    Refill:  0    Order Specific Question:  Supervising Provider    Answer:  Vida RollerMILLER, BRIAN D [id number][id number]]      Howard Filter Strupp Charlotte Hallamprubi-Soms, PA-C 03/26/14 1652  Mirian MoMatthew Gentry, MD 03/26/14 1946  Mirian MoMatthew Gentry, MD 03/26/14 1949

## 2014-03-26 NOTE — ED Notes (Addendum)
Used translator phones, Pt reports drinking an energy drink yesterday and now feeling anxious and shakey today. Then reported that he is allergic to caffeine, it causes him to feel jittery and nervous. No acute distress noted at triage.

## 2014-03-26 NOTE — Discharge Instructions (Signed)
Use zofran as prescribed, as needed for nausea. Stay well hydrated with small sips of fluids throughout the day. Avoid caffeine drinks. Follow up with the primary care doctor listed above for recheck in one week. Call if bloody stools, persistent diarrhea, vomiting, fever or abdominal pain. Return to ER for changing or worsening of symptoms.   Nuseas, Adulto (Nausea, Adult)  La nusea es la sensacin de Dentistmalestar en el estmago o de la necesidad de vomitar. No constituye una preocupacin seria en s misma, pero puede ser un signo de problemas mdicos ms graves. Si empeora, puede provocar vmitos. Si aparecen vmitos, hay riesgo de deshidratacin.  CAUSES   Infecciones por virus.  Intoxicacin alimentaria.  Medicamentos.  Embarazo.  Mareos por movimiento.  Cefaleas migraosas.  Estrs emocional.  Dolor intenso producido en Corporate treasurercualquier lugar.  Intoxicacin por alcohol. INSTRUCCIONES PARA EL CUIDADO EN EL HOGAR   Debe hacer reposo.  Pida instrucciones especficas a su mdico con respecto a la rehidratacin.  Consuma cantidades pequeas de alimentos y tome sorbos de lquidos con ms frecuencia.  [employer] todos los medicamentos como le indic el mdico. SOLICITE ATENCIN MDICA SI:   No mejora, o Quinbyempeora, despus de [address] de tratamiento.  Tiene cefalea. SOLICITE ATENCIN MDICA DE INMEDIATO SI:  Tiene fiebre.  Se desmaya.  Sigue vomitando u observa sangre en el vmito.  Se siente extremadamente dbil o deshidratado.  La materia fecal es negra o tiene [place].  Siente dolor intenso en el pecho o en el abdomen. ASEGRESE DE QUE:   Comprende estas instrucciones.  Controlar su enfermedad.  Solicitar ayuda de inmediato si no mejora o si empeora. Document Released: 04/04/2005 Document Revised: 12/28/2011 [facility] Patient Information 2015 [employer] [place]. This information is not intended to replace advice given to you by your health care provider. Make sure you discuss  any questions you have with your health care provider.

## 2014-03-26 NOTE — ED Provider Notes (Signed)
CSN: [id number]     Arrival date & time 03/26/14  1000 History   First MD Initiated Contact with Patient 03/26/14 1108     Chief Complaint  Patient presents with  . Anxiety    (Consider location/radiation/quality/duration/timing/severity/associated sxs/prior Treatment) The history is provided by the patient. The history is limited by a language barrier. A language interpreter was used.    Howard Bender is a 23 yo with report of nausea and feeling shaky after drinking an energy drink while exercising yesterday.  Howard Bender reports this feeling has improved but has not completely resolved and Howard Bender continues to feel nauseated.  Howard Bender denies any pain or shortness of breath at any time and denies any palpitations.  Howard Bender denies any fevers, chills or vomiting.  History reviewed. No pertinent past medical history. History reviewed. No pertinent past surgical history. Family History  Problem Relation Age of Onset  . Diabetes Mother   . High blood pressure Mother   . Cancer Maternal Grandmother   . Prostate cancer Maternal Grandfather    History  Substance Use Topics  . Smoking status: Never Smoker   . Smokeless tobacco: Never Used  . Alcohol Use: No    Review of Systems  Constitutional: Negative for fever and chills.  HENT: Negative for sore throat.   Eyes: Negative for visual disturbance.  Respiratory: Negative for cough and shortness of breath.   Cardiovascular: Negative for chest pain, palpitations and leg swelling.  Gastrointestinal: Positive for nausea. Negative for vomiting and diarrhea.  Genitourinary: Negative for dysuria.  Musculoskeletal: Negative for myalgias.  Skin: Negative for rash.  Neurological: Negative for weakness, numbness and headaches.  Psychiatric/Behavioral: The patient is nervous/anxious.     Allergies  Caffeine  Home Medications   Prior to Admission medications   Medication Sig Start Date End Date Taking? Authorizing Provider  NON FORMULARY Take 1 Dose by mouth  once. "ZipFizz" energy powder   Yes Historical Provider, MD  nortriptyline (PAMELOR) 10 MG capsule Take 3 capsules at bedtime 01/27/14  Yes Van ClinesKaren M Aquino, MD   BP 129/65 mmHg  Pulse 94  Temp(Src) 98.1 F (36.7 C) (Oral)  Resp 18  SpO2 100% Physical Exam  Constitutional: Howard Bender appears well-developed and well-nourished. No distress.  HENT:  Head: Normocephalic and atraumatic.  Mouth/Throat: Oropharynx is clear and moist. No oropharyngeal exudate.  Eyes: Conjunctivae are normal.  Neck: Neck supple. No thyromegaly present.  Cardiovascular: Normal rate, regular rhythm and intact distal pulses.   Pulmonary/Chest: Effort normal and breath sounds normal. No respiratory distress. Howard Bender has no wheezes. Howard Bender has no rales. Howard Bender exhibits no tenderness.  Abdominal: Soft. There is no tenderness.  Musculoskeletal: Howard Bender exhibits no tenderness.  Lymphadenopathy:    Howard Bender has no cervical adenopathy.  Neurological: Howard Bender is alert.  Skin: Skin is warm and dry. No rash noted. Howard Bender is not diaphoretic.  Psychiatric: Howard Bender has a normal mood and affect.  Nursing note and vitals reviewed.   ED Course  Procedures (including critical care time) Labs Review Labs Reviewed - No data to display  Imaging Review No results found.   EKG Interpretation None      MDM   Final diagnoses:  Adverse effect of caffeine, initial encounter   23 yo male presenting with report of nausea and shakiness since ingesting caffeine drink during work-out yesterday.  On exam, Howard Bender is well-appearing and his heart rate is rate controlled and not irregular and therefore diagnostic studies deferred.  NS bolus and nausea medicines given and pt  reports symptoms resolved.  Discussed with pt via interpreter to rest, drink fluids and proper diet and avoid caffeine concentrated drinks.  Pt reports understanding and asked if Howard Bender can workout again. Discussed with pt Howard Bender may work-out if Howard Bender feels well but Howard Bender should continue to encourage oral hydration.  Pt is  well-appearing, in no acute distress and vital signs are stable.  They appear safe to be discharged.  Discharge include resources to establish care with a PCP for follow-up.  Return precautions provided. Pt aware of plan and in agreement.   Filed Vitals:   03/26/14 1009 03/26/14 1120 03/26/14 1230 03/26/14 1245  BP: 136/75 129/65 109/55 128/63  Pulse: 95 94 87 95  Temp: 98.1 F (36.7 C)  98.4 F (36.9 C)   TempSrc: Oral  Oral   Resp: 18 18 20 24   SpO2: 98% 100% 100% 100%   Meds given in ED:  Medications  sodium chloride 0.9 % bolus 1,000 mL (1,000 mLs Intravenous New Bag/Given 03/26/14 1200)  ondansetron (ZOFRAN) injection 4 mg (4 mg Intravenous Given 03/26/14 1200)    New Prescriptions   No medications on file     Harle BattiestElizabeth Reece Mcbroom, NP 03/26/14 2236  Flint MelterElliott L Wentz, MD 03/30/14 [phone]

## 2014-03-26 NOTE — ED Notes (Signed)
pts vital signs updated pt awaiting discharge paperwork at bedside.  

## 2014-03-26 NOTE — ED Notes (Signed)
Pt seen here earlier today for nausea and vomiting.  Sts he was given IV medicine for nausea and was discharged home.  Sts he thought he was feeling better and then went to work today where he started feeling weak and nauseous again.  Was not given rx for zofran at discharge. No other complaints.

## 2014-03-26 NOTE — ED Notes (Addendum)
Spanish speaking pt- came in with upset stomach and nausea, he was seen here earlier today and says he still feels sick. He was not given a RX for nausea medicine, only given IV medication. C/o nausea. denies emesis

## 2014-03-26 NOTE — ED Provider Notes (Signed)
CSN: [id number]     Arrival date & time 03/26/14  1912 History   First MD Initiated Contact with Patient 03/26/14 2045     Chief Complaint  Patient presents with  . Nausea     (Consider location/radiation/quality/duration/timing/severity/associated sxs/prior Treatment) The history is provided by the patient. The history is limited by a language barrier. A language interpreter was used (interpreter phone).    Pt is a 23yo male, presenting to ED for 3rd time today with c/o nausea. Pt initially presented to ED with c/o feeling jittery and nauseated after drinking a caffeinated drink.  Pt was given IV fluids, nausea mediation and discharge home. Pt returned again with c/o nausea, was prescribed zofran but returns a 3rd time stating after he took the zofran, he felt numbness in his head, hands, and feet, and feels nervous.  Pt states he has not eaten anything today but did drink 2 electrolyte enhanced drinks. Denies chest pain, SOB, abdominal pain, vomiting or diarrhea. Denies fever or chills. Pt requesting to be given something that makes him feel better because when he leaves the ED he feels bad again.    History reviewed. No pertinent past medical history. History reviewed. No pertinent past surgical history. Family History  Problem Relation Age of Onset  . Diabetes Mother   . High blood pressure Mother   . Cancer Maternal Grandmother   . Prostate cancer Maternal Grandfather    History  Substance Use Topics  . Smoking status: Never Smoker   . Smokeless tobacco: Never Used  . Alcohol Use: No    Review of Systems  Constitutional: Negative for fever, chills and fatigue.  Respiratory: Negative for cough and shortness of breath.   Cardiovascular: Negative for chest pain and palpitations.  Gastrointestinal: Positive for nausea. Negative for vomiting, abdominal pain, diarrhea and constipation.  Genitourinary: Negative for dysuria, frequency and flank pain.  All other systems reviewed and  are negative.     Allergies  Phenergan and Caffeine  Home Medications   Prior to Admission medications   Medication Sig Start Date End Date Taking? Authorizing Provider  NON FORMULARY Take 1 Dose by mouth once. "ZipFizz" energy powder   Yes Historical Provider, MD  nortriptyline (PAMELOR) 10 MG capsule Take 3 capsules at bedtime 01/27/14  Yes Van ClinesKaren M Aquino, MD  ondansetron (ZOFRAN) 8 MG tablet Take 1 tablet (8 mg total) by mouth every 8 (eight) hours as needed for nausea or vomiting. 03/26/14  Yes Mercedes Strupp Camprubi-Soms, PA-C  LORazepam (ATIVAN) 1 MG tablet Take 1 tablet (1 mg total) by mouth 3 (three) times daily as needed for anxiety. 03/27/14   Junius FinnerErin O'Malley, PA-C   BP 131/72 mmHg  Pulse 99  Temp(Src) 97.7 F (36.5 C) (Oral)  Resp 20  SpO2 100% Physical Exam  Constitutional: He appears well-developed and well-nourished.  Pt lying comfortably in exam bed, NAD.   HENT:  Head: Normocephalic and atraumatic.  Eyes: Conjunctivae are normal. No scleral icterus.  Neck: Normal range of motion.  Cardiovascular: Normal rate, regular rhythm and normal heart sounds.   Pulmonary/Chest: Effort normal and breath sounds normal. No respiratory distress. He has no wheezes. He has no rales. He exhibits no tenderness.  Abdominal: Soft. Bowel sounds are normal. He exhibits no distension and no mass. There is no tenderness. There is no rebound and no guarding.  Soft, non-distended, non-tender. No CVAT  Musculoskeletal: Normal range of motion.  Neurological: He is alert.  Skin: Skin is warm and dry.  Nursing note and vitals reviewed.   ED Course  Procedures (including critical care time) Labs Review Labs Reviewed  I-STAT CHEM 8, ED - Abnormal; Notable for the following:    Glucose, Bld 117 (*)    Calcium, Ion 1.10 (*)    All other components within normal limits    Imaging Review No results found.   EKG Interpretation None      MDM   Final diagnoses:  Anxiety  Nausea     Pt is a 23yo male presenting to ED for 3rd time today with c/o feeling nauseated but denies fever, chills, vomiting or diarrhea. Pt took zofran PTA, caused him to have numbness in his fingers and toes. Vitals: WNL. Abd: soft, non-distended. Non-tender.  Pt denies any pain.  Not concerned for surgical abdomen.  Chem-8: unremarkable.  Will give IV fluids, phenergan, and meal tray.      10:20 PM after given phenergan, pt became anxious, developed palpitations. Tachycardic- HR 144  12:09 AM Pt sleeping comfortably in exam bed, HR 84, sinus rhythm.    Pt's father states pt is doing better now that he has been able to sleep. Pt feels comfortable being discharged home. Encouraged pt to get plenty of rest and to eat a good meal in the morning. Advised to stay away from caffeine including tea, soda, and coffee. Home care instructions provided. Return precautions provided. Pt and father verbalized understanding and agreement with tx plan.     Junius Finnerrin O'Malley, PA-C 03/27/14 [id number]  Juliet RudeNathan R. Rubin PayorPickering, MD 03/29/14 [phone]

## 2014-03-26 NOTE — Discharge Instructions (Signed)
Please avoid any future caffeine drinks to avoid this feeling.  Encourage drinking fluids and rest.  You may exercise if you feel ok.  Please establish care with a primary care provider to ensure you are getting better.  Don't hesitate to return for new, worsening or concerning symptoms.    Return for any chest pain, shortness of breath, palpitations, nausea, vomiting or headache.    Emergency Department Resource Guide 1) Find a Doctor and Pay Out of Pocket Although you won't have to find out who is covered by your insurance plan, it is a good idea to ask around and get recommendations. You will then need to call the office and see if the doctor you have chosen will accept you as a new patient and what types of options they offer for patients who are self-pay. Some doctors offer discounts or will set up payment plans for their patients who do not have insurance, but you will need to ask so you aren't surprised when you get to your appointment.  2) Contact Your Local Health Department Not all health departments have doctors that can see patients for sick visits, but many do, so it is worth a call to see if yours does. If you don't know where your local health department is, you can check in your phone book. The CDC also has a tool to help you locate your state's health department, and many state websites also have listings of all of their local health departments.  3) Find a Walk-in Clinic If your illness is not likely to be very severe or complicated, you may want to try a walk in clinic. These are popping up all over the country in pharmacies, drugstores, and shopping centers. They're usually staffed by nurse practitioners or physician assistants that have been trained to treat common illnesses and complaints. They're usually fairly quick and inexpensive. However, if you have serious medical issues or chronic medical problems, these are probably not your best option.  No Primary Care Doctor: - Call  Health Connect at  [phone] - they can help you locate a primary care doctor that  accepts your insurance, provides certain services, etc. - Physician Referral Service- [phone]  Chronic Pain Problems: Organization         Address  Phone   Notes  [facility]  [phone] Patients need to be referred by their primary care doctor.   Medication Assistance: Organization         Address  Phone   Notes  [facility] Medication [facility] [address] [place], [place] [address] [phone]) [phone] --Must be a resident of [place] [facility] -- Must have NO insurance coverage whatsoever (no Medicaid/ Medicare, etc.) -- The pt. MUST have a primary care doctor that directs their care regularly and follows them in the community   MedAssist  [phone]   [employer]  [phone]    Agencies that provide inexpensive medical care: Organization         Address  Phone   Notes  [facility]  [phone]   [facility]    [phone]   [facility] [address] [place], [place] [address] [phone]) [phone]   [facility] [address], [place] [phone]) [phone]   [facility]    [phone]) [phone]   [facility]    [phone]) [phone]   [facility]  [phone]  [address], [place] Phone:  [phone], Fax:  [phone] Hours of Operation:  9 am - 6 pm, M-F.  Also accepts Medicaid/Medicare and self-pay.  [facility] for [place], [address], [place] Phone: [phone], Fax: [phone]. Hours of Operation:  8:30 am - 5:30 pm, M-F.  Also accepts Medicaid and self-pay.  [facility] [address], [place] Phone: [phone]   [place], [place], [place] [phone], Ext. [phone] Mondays & Thursdays: 7-9 AM.  First 15 patients are seen on a first come, first serve  basis.    [place] [place] Providers:  Organization         Address  Phone   Notes  [facility] [address], [place] [phone] Also accepts self-pay patients.  [facility] [address], [place]  [phone]   [place], [address], [place] [phone]   [facility] [address] [place] [phone]   Lucianne Lei [address], [place]   [phone] Only accepts [place] Access [place] patients after they have their name applied to their card.   Self-Pay (no insurance) in [facility]:  Organization         Address  Phone   Notes  Sickle Cell Patients, [facility] [place] [phone]   [facility] [place] [phone]   [facility] [place]  [place], [place], [place] [phone]   [facility]/Dr. Osei-Bonsu  [address], [place] or [address], [place] [phone] Phone number for both [place] and [place] locations is the same.  Urgent Medical and [facility] [address], [place] [phone]   [facility] [address] [place] or [address] [phone]   [facility] [address] [place] [phone], phone; [phone], fax Sees patients 1st and 3rd Saturday of every month.  Must not qualify for public or private insurance (i.e. Medicaid, Medicare, [place] Health Choice, Veterans' Benefits)  Household income should be no more than 200% of the poverty level The clinic cannot treat you if you are pregnant or think you are pregnant  Sexually transmitted diseases are not treated at the clinic.    Dental Care: Organization         Address  Phone  Notes  [facility] of [facility] [place] [facility] [place] [phone] Accepts children up to age 31 who are enrolled in [place] or [place]; pregnant women with a Medicaid card; and children who have applied for Medicaid or [place] Health Choice, but were declined, whose parents can pay a reduced fee at time of service.  [facility] Department of [facility]  [address], [place] [phone] Accepts children up to age 75 who are enrolled in [place] or [place]; pregnant women with a Medicaid card; and children who have applied for Medicaid or [place] Health Choice, but were declined, whose parents can pay a reduced fee at time of service.  [facility] Adult Dental Access PROGRAM  [place], [place] [phone] Patients  are seen by appointment only. Walk-ins are not accepted. [place] will see patients 23 years of age and older. Monday - Tuesday (8am-5pm) Most Wednesdays (8:30-5pm) $30 per visit, cash only  [facility] Adult Dental Access PROGRAM  [address], [facility] [phone] Patients are seen by appointment only. Walk-ins are not accepted. [place] will see patients 32 years of age and older. One Wednesday Evening (Monthly: Volunteer Based).  $30 per visit, cash only  [place]  [phone] for adults; Children under age 44, call Graduate Pediatric Dentistry at [phone]. Children aged 91-14, please call [phone] to request a pediatric application.  Dental services are provided in all areas of dental care including fillings, crowns and bridges, complete and partial dentures, implants, gum treatment, root canals, and extractions. Preventive care is also provided. Treatment is provided to both adults and children. Patients are selected via a lottery and there is often a waiting list.   [facility] [address], [place]  [phone]  [email or web address]   [facility] [address] [place], [place] [phone], Ext. [phone] Second and Fourth Thursday of each month, opens at 6:30 AM; Clinic ends at 9 AM.  Patients are seen on a first-come first-served basis, and a limited number are seen during each clinic.   [facility]  [address] Hillard Danker [place], [place] [phone]   Eligibility Requirements You must have lived in [place], [place], or [place] counties for at least the last three months.   You cannot be eligible for state or federal sponsored [employer], including [employer], [place], or [employer].   You generally cannot be eligible for healthcare insurance through your employer.    How to apply: Eligibility screenings are held every Tuesday and Wednesday afternoon from 1:00 pm until 4:00 pm. You do not need an appointment for the interview!  [facility] [address], [place], [place]   [place]  [place] Department  [place]  [phone]    Behavioral Health Resources in the Community: Intensive Outpatient Programs Organization         Address  Phone  Notes  [place] [facility]. [address] [place] [address], [place], [place] [phone]   [facility] [address], [place], [place]   ADS: [facility] [address], [place], [place]   [place] [address],  [place], [place] or [phone]   Substance Abuse Resources Organization         Address  Phone  Notes  Alcohol and Drug Services  [phone]   [place]  [phone]   [facility] [place]   Chinita Pester  [phone]   Residential & Outpatient Substance Abuse Program  [phone]   Psychological Services Organization          Address  Phone  Notes  [facility]  [place]  [phone]   [place] [address] [place] or [phone]    Mobile Crisis Teams Organization         Address  Phone  Notes  [facility], Mobile Crisis Care Unit  [phone]   [facility]  [address] [place], [place]   [facility] [address] [place] 18  [place] [phone]    Self-Help/Support Groups Organization         Address  Phone             Notes  [facility] of [place] - variety of support groups  336- [phone] Call for more information  Narcotics Anonymous (NA), [facility] [address], [place]  2 meetings at this location   Statisticianesidential Treatment Programs Organization         Address  Phone  Notes  [facility] [address],    [place]  [phone]   [facility]  [address], [place] [phone], [place], [place] [phone]   [facility] [address], [place] [phone] Admissions: 8am-3pm M-F  [facility] [address]    [place], [place] [phone]   [facility] [address], [place], [place] [phone]   [facility] [address]  [place], [place] [phone]   [facility] - Intensive Outpatient [address], [place], [place] [phone]   [facility] ([facility] [address]  [place], [place] [phone] or [phone]   [facility] (RTS) [address] [place], [place] [phone] Accepts Medicaid  [facility] [address]  [place] [phone] Substance Abuse/Addiction Treatment   [facility] Behavioral Health Resources Organization         Address  Phone  Notes  [facility]  [phone]) [phone]   Angie FavaJulie Brannon, PhD [address], [address] [place], [place]   [phone](336) [phone] or [phone]) [phone]   [facility]   [address] [place], [place] [phone]) [phone]   [facility] [address], [place], [place] [phone]) [phone] Insurance/Medicaid/sponsorship through [facility]  [facility]ith and Families [address]                                    [place], [place] [phone]) [phone] Therapy/tele-psych/case  [facility] [address]   [place], [place] [phone]) [phone]    Dr. Lolly MustacheArfeen  [phone]   [facility] [place]  United Way [facility] 1) [address], [place] 2) [address], [place] 3)  [address], [place] [phone]) [phone]) [phone]  [phone]) [phone]   [facility] Child Abuse Hotline [phone]) [phone] or [phone](336) [phone] (After Hours)

## 2014-03-27 MED ORDER — LORAZEPAM 1 MG PO TABS: 1.0000 mg | ORAL_TABLET | Freq: Three times a day (TID) | ORAL | Status: DC | PRN

## 2014-03-28 ENCOUNTER — Ambulatory Visit: Payer: Self-pay | Admitting: Neurology

## 2014-03-28 ENCOUNTER — Encounter (HOSPITAL_COMMUNITY): Payer: Self-pay | Admitting: *Deleted

## 2014-03-28 ENCOUNTER — Emergency Department (HOSPITAL_COMMUNITY)
Admission: EM | Admit: 2014-03-28 | Discharge: 2014-03-28 | Disposition: A | Discharge status: Home / Self Care | Payer: Self-pay | Attending: Emergency Medicine | Admitting: Emergency Medicine

## 2014-03-28 DIAGNOSIS — G4489 Other headache syndrome: Secondary | ICD-10-CM | POA: Insufficient documentation

## 2014-03-28 DIAGNOSIS — Z79899 Other long term (current) drug therapy: Secondary | ICD-10-CM | POA: Insufficient documentation

## 2014-03-28 NOTE — ED Notes (Signed)
Patient states head numbness x 3 years, pt also with n/v, seen here for same yesterday

## 2014-03-28 NOTE — Discharge Instructions (Signed)
Dolor de cabeza general sin causa  °(General Headache Without Cause)  ° EL dolor de cabeza es un dolor o malestar que se siente en la zona de la cabeza o del cuello. Puede no tener una causa específica. Hay muchas causas y tipos de dolores de cabeza. Los más comunes son:  °· Cefalea tensional. °· Cefaleas migrañosas. °· Cefalea en brotes. °· Cefaleas diarias crónicas. °INSTRUCCIONES PARA EL CUIDADO EN EL HOGAR  °· Cumpla con todas las citas programadas con su médico o con el especialista al que lo hayan derivado. °· Sólo tome medicamentos de venta libre o recetados para calmar el dolor o el malestar, según las indicaciones de su médico. °· Cuando sienta dolor de cabeza acuéstese en un cuarto oscuro y tranquilo. °· Lleve un registro diario para averiguar lo que puede provocarlo. Por ejemplo, escriba: °¨ Lo que come y bebe. °¨ Cuánto tiempo duerme. °¨ Todo cambio en la dieta o medicamentos. °· Intente con masajes u otras técnicas de relajación. °· Colóquese compresas de hielo o calor en la cabeza y en el cuello. Úselos 3 a 4 veces por día de 15 a 20 minutos por vez, o como sea necesario. °· Limite las situaciones de estrés. °· Siéntese con la espalda recta y no  tense los músculos. °· Si fuma, deje de hacerlo. °· Limite el consumo de bebidas alcohólicas. °· Consuma menos cantidad de cafeína o deje de tomarla. °· Coma y duerma en horarios regulares. °· Duerma entre 7 y 9 horas o como lo indique su médico. °· Mantenga las luces tenues si le molestan las luces brillantes y empeoran el dolor de cabeza. °SOLICITE ATENCIÓN MÉDICA SI:  °· Tiene problemas con los medicamentos que le recetaron. °· Los medicamentos no le hacen efecto. °· El dolor de cabeza que sentía habitualmente es diferente. °· Tiene náuseas o vómitos. °SOLICITE ATENCIÓN MÉDICA DE INMEDIATO SI:  °· El dolor se hace cada vez más intenso. °· Tiene fiebre. °· Presenta rigidez en el cuello. °· Sufre pérdida de la visión. °· Presenta debilidad muscular o pérdida  del control muscular. °· Comienza a perder el equilibrio o tiene problemas para caminar. °· Sufre mareos o se desmaya. °· Tiene síntomas graves que son diferentes a los primeros síntomas. °ASEGÚRESE DE QUE:  °· Comprende estas instrucciones. °· Controlará su enfermedad. °· Solicitará ayuda de inmediato si no mejora o empeora. °Document Released: 01/12/2005 Document Revised: 10/04/2011 °ExitCare® Patient Information ©2015 ExitCare, LLC. This information is not intended to replace advice given to you by your health care provider. Make sure you discuss any questions you have with your health care provider. ° °

## 2014-03-28 NOTE — ED Provider Notes (Signed)
CSN: [id number]     Arrival date & time 03/28/14  [id number] History   First MD Initiated Contact with Patient 03/28/14 [phone]     Chief Complaint  Patient presents with  . Headache      Patient is a 23 y.o. male presenting with headaches. The history is provided by the patient. A language interpreter was used (language line [phone]#[id number]).  Headache Pain location:  R temporal and L temporal Onset quality:  Gradual Duration: 1 year. Timing:  Constant Progression:  Worsening Chronicity:  Chronic Relieved by:  Nothing Worsened by:  Nothing tried Associated symptoms: blurred vision, dizziness, fatigue and numbness   Associated symptoms: no abdominal pain, no fever, no neck pain, no syncope and no vomiting    Patient reports daily headaches for over a year He also reports intermittent blurred vision He also reports numbness to both sides of his face He reports weakness "in the body and joints" per interpreter He reports he has had increased dizziness over past several days No head trauma He thinks he has cancer and a brain tumor He has seen neurology for migraines.  He was supposed to see them today but it was delayed until next week due to lack of interpreter  No recent travel.  No tick bites reported.     PMH - migraines Soc hx - no recent travel Family History  Problem Relation Age of Onset  . Diabetes Mother   . High blood pressure Mother   . Cancer Maternal Grandmother   . Prostate cancer Maternal Grandfather    History  Substance Use Topics  . Smoking status: Never Smoker   . Smokeless tobacco: Never Used  . Alcohol Use: No    Review of Systems  Constitutional: Positive for fatigue. Negative for fever.  Eyes: Positive for blurred vision.  Cardiovascular: Negative for chest pain and syncope.  Gastrointestinal: Negative for vomiting and abdominal pain.  Musculoskeletal: Negative for neck pain.  Neurological: Positive for dizziness, numbness and headaches. Negative for  syncope.  Psychiatric/Behavioral: Positive for sleep disturbance.  All other systems reviewed and are negative.     Allergies  Phenergan and Caffeine  Home Medications   Prior to Admission medications   Medication Sig Start Date End Date Taking? Authorizing Provider  NON FORMULARY Take 1 Dose by mouth once. "ZipFizz" energy powder   Yes Historical Provider, MD  nortriptyline (PAMELOR) 10 MG capsule Take 3 capsules at bedtime Patient taking differently: Take 30 mg by mouth at bedtime. Take 3 capsules at bedtime 01/27/14  Yes Van ClinesKaren M Aquino, MD  ondansetron (ZOFRAN) 8 MG tablet Take 1 tablet (8 mg total) by mouth every 8 (eight) hours as needed for nausea or vomiting. 03/26/14  Yes Mercedes Strupp Camprubi-Soms, PA-C  LORazepam (ATIVAN) 1 MG tablet Take 1 tablet (1 mg total) by mouth 3 (three) times daily as needed for anxiety. Patient not taking: Reported on 03/28/2014 03/27/14   Junius FinnerErin O'Malley, PA-C   BP 138/57 mmHg  Temp(Src) 98.3 F (36.8 C) (Oral)  Resp 18  Wt 130 lb (58.968 kg)  SpO2 100% Physical Exam CONSTITUTIONAL: Well developed/well nourished HEAD: Normocephalic/atraumatic EYES: EOMI/PERRL, no nystagmus, no ptosis ENMT: Mucous membranes moist NECK: supple no meningeal signs, no bruits SPINE/BACK:entire spine nontender CV: S1/S2 noted, no murmurs/rubs/gallops noted LUNGS: Lungs are clear to auscultation bilaterally, no apparent distress ABDOMEN: soft, nontender, no rebound or guarding GU:no cva tenderness NEURO:Awake/alert, facies symmetric, no arm or leg drift is noted Equal 5/5 strength with shoulder abduction, elbow  flex/extension, wrist flex/extension in upper extremities and equal hand grips bilaterally Equal 5/5 strength with hip flexion,knee flex/extension, foot dorsi/plantar flexion Cranial nerves 3/4/5/6/10/24/08/11/12 tested and intact Gait normal without ataxia No past pointing Sensation to light touch intact in all extremities EXTREMITIES: pulses normal,  full ROM SKIN: warm, color normal, no rash PSYCH: no abnormalities of mood noted, alert and oriented to situation   ED Course  Procedures   Pt well appearing His neuro exam is unremarkable I spoke to patient extensively with interpreter His headaches are chronic/daily and has neurologist that treats him for migraines   He was most concerned as he thought he had brain tumor He had CT head in may 2015 that was negative I reassured him that it was unlikely he now had a brain tumor He has neuro followup next week Repeat ekg done due to dizziness and it is unremarkable I reviewed previous ED visits (seen for dizziness after drinking energy drinks) I doubt SAH or other acute neurologic emergency    EKG Interpretation   Date/Time:  Friday March 28 2014 09:54:49 EST Ventricular Rate:  84 PR Interval:  142 QRS Duration: 85 QT Interval:  329 QTC Calculation: 389 R Axis:   90 Text Interpretation:  Sinus rhythm Borderline right axis deviation rate is  improved when compared to prior Confirmed by Bebe ShaggyWICKLINE  MD, Osby Sweetin ([id number])  on 03/28/2014 9:57:43 AM      MDM   Final diagnoses:  Other headache syndrome    Nursing notes including past medical history and social history reviewed and considered in documentation Previous records reviewed and considered     Joya Gaskinsonald W Janiya Millirons, MD 03/28/14 1002

## 2014-04-03 ENCOUNTER — Ambulatory Visit (INDEPENDENT_AMBULATORY_CARE_PROVIDER_SITE_OTHER): Payer: Self-pay | Admitting: Neurology

## 2014-04-03 ENCOUNTER — Encounter: Payer: Self-pay | Admitting: Neurology

## 2014-04-03 VITALS — BP 120/78 | HR 86 | Resp 16 | Ht 63.5 in | Wt 127.0 lb

## 2014-04-03 DIAGNOSIS — R519 Headache, unspecified: Secondary | ICD-10-CM

## 2014-04-03 DIAGNOSIS — R51 Headache: Secondary | ICD-10-CM

## 2014-04-03 DIAGNOSIS — H8110 Benign paroxysmal vertigo, unspecified ear: Secondary | ICD-10-CM

## 2014-04-03 MED ORDER — NORTRIPTYLINE HCL 50 MG PO CAPS: 50.0000 mg | ORAL_CAPSULE | Freq: Every day | ORAL | Status: DC

## 2014-04-03 NOTE — Progress Notes (Signed)
NEUROLOGY FOLLOW UP OFFICE NOTE  Howard Bender [id number]  HISTORY OF PRESENT ILLNESS: I had the pleasure of seeing Howard Formbel Gammage in follow-up in the neurology clinic on 04/03/2014.  The patient was last seen 2 months ago for headaches. He is again accompanied by his friend who helps supplement history today. A Spanish interpreter helps with translation.  Records and images were personally reviewed where available.  Since his last visit, he has been to the ER after he had a different episode where he felt nauseated and shaky after drinking an energy drink while exercising. He reports that the pain in his eyes while watching TV has resolved. He however has pressure in the back of his head described as an "annoying" sensation that radiates to the front, with pounding in his forehead and temporal region, occurring on a daily basis, lasting for a few hours. This would be associated with dizziness, where the pressure in the back of his head makes him feel like his head is heavy when he turns his head. If he stands and turns around constantly at work, he starts getting dizzy with spinning sensation, no nausea. The occipital symptoms get better when he eats, but this does not seem to help with the pounding in the front. He states that he always feels something in his head, worsening when he works. He reports that he was having these symptoms started 2 years ago with no change with nortriptyline intake. He denies any neck pain, tinnitus, no head injuries.  HPI: This is a pleasant 23 yo RH man with no significant past medical history in his usually state of health until the past year when he started having left temporal, bilateral retro-orbital and periorbital (above the eyelids) sharp pain with associated photosensitivity. He reports that pain usually occurs when he walks around, he feels better sitting down and after eating. He feels that symptoms started when he began using his cellphone, with his head  bent forward. Extending his head back seemed to help. He reported pain occurs daily, with rarely a day without pain. 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 When the pain first started, he was having associated nausea, which was relieved with Zofran. He has stopped taking this. He also started having dizziness described as a brief spinning sensation with head movement, resolving when head is still. He also had black floaters in his vision. He had seen an optometrist twice and was told vision was okay. He had seen ENT, evaluation unremarkable, and symptoms felt to be due to migraine. He took 2 tablets of Excedrin migraine one time, however had side effects of whole body numbness, leading to an [facility] 08[facility]2013 where head CT done which I personally reviewed was unremarkable. He initially reported improvement in symptoms with nortriptyline 30mg  qhs.  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 There is no family history of headaches.  PAST MEDICAL HISTORY: No past medical history on file.  MEDICATIONS: Nortriptyline 10mg  3 caps qhs   No current facility-administered medications on file prior to visit.    ALLERGIES: Allergies  Allergen Reactions  . Phenergan [Promethazine Hcl] Anxiety and Palpitations  . Caffeine Nausea And Vomiting    FAMILY HISTORY: Family History  Problem Relation Age of Onset  . Diabetes Mother   . High blood pressure Mother   . Cancer  Maternal Grandmother   . Prostate cancer Maternal Grandfather     SOCIAL HISTORY: History   Social History  . Marital Status: Married    Spouse Name: N/A    Number of Children: 1  . Years of Education: N/A   Occupational History  . cook    Social History Main Topics  . Smoking status: Never Smoker   . Smokeless tobacco: Never Used  . Alcohol Use: No  . Drug Use: No  . Sexual Activity: Not on file    Other Topics Concern  . Not on file   Social History Narrative   ** Merged History Encounter **        REVIEW OF SYSTEMS: Constitutional: No fevers, chills, or sweats, no generalized fatigue, change in appetite Eyes: No visual changes, double vision, eye pain Ear, nose and throat: No hearing loss, ear pain, nasal congestion, sore throat Cardiovascular: No chest pain, palpitations Respiratory:  No shortness of breath at rest or with exertion, wheezes GastrointestinaI: No nausea, vomiting, diarrhea, abdominal pain, fecal incontinence Genitourinary:  No dysuria, urinary retention or frequency Musculoskeletal:  No neck pain, back pain Integumentary: No rash, pruritus, skin lesions Neurological: as above Psychiatric: No depression, insomnia, anxiety Endocrine: No palpitations, fatigue, diaphoresis, mood swings, change in appetite, change in weight, increased thirst Hematologic/Lymphatic:  No anemia, purpura, petechiae. Allergic/Immunologic: no itchy/runny eyes, nasal congestion, recent allergic reactions, rashes  PHYSICAL EXAM: Filed Vitals:   04/03/14 0942  BP: 120/78  Pulse: 86  Resp: 16   General: No acute distress Head:  Normocephalic/atraumatic Neck: supple, no paraspinal tenderness, full range of motion Heart:  Regular rate and rhythm Lungs:  Clear to auscultation bilaterally Back: No paraspinal tenderness Skin/Extremities: No rash, no edema Neurological Exam: alert and oriented to person, place, and time. No aphasia or dysarthria. Fund of knowledge is appropriate.  Recent and remote memory are intact.  Attention and concentration are normal.    Able to name objects and repeat phrases. Cranial nerves: Pupils equal, round, reactive to light.  Fundoscopic exam unremarkable, no papilledema. Extraocular movements intact with no nystagmus. Visual fields full. Facial sensation intact. No facial asymmetry. Tongue, uvula, palate midline.  Motor: Bulk and tone normal, muscle  strength 5/5 throughout with no pronator drift.  Sensation to light touch intact.  No extinction to double simultaneous stimulation.  Deep tendon reflexes 2+ throughout, toes downgoing.  Finger to nose testing intact.  Gait narrow-based and steady, able to tandem walk adequately.  Romberg negative.  IMPRESSION: This is a pleasant 23 yo RH man who presented with chronic daily headaches for the past year. Headaches are over the orbital and left temporal regions,with some migrainous features. Neurological exam and head CT normal. He initially reported good response to nortriptyline, and states the headaches he initially reported have resolved, however he continues to have pressure sensation in the occipital region, with positional dizziness on head turn. 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 Nortriptyline dose will be increased to 50mg  qhs for symptomatic treatment of headaches. Side effects were discussed. He will follow-up in 3 months and knows to call our office for any problems.   Thank you for allowing me to participate in his care.  Please do not hesitate to call for any questions or concerns.  The duration of this appointment visit was 15 minutes of face-to-face time with the patient.  Greater than 50% of this time was spent in counseling, explanation of diagnosis, planning  of further management, and coordination of care.   Patrcia DollyKaren Aquino, M.D.   CC: Dr. Lonzo CloudShamleffer

## 2014-04-03 NOTE — Patient Instructions (Signed)
1. Increase nortriptyline to 50mg  at bedtime 2. Start physical therapy for positional dizziness (positional vertigo versus cervicogenic dizziness) 3. Follow-up in 3 months

## 2014-04-07 ENCOUNTER — Encounter: Payer: Self-pay | Admitting: Neurology

## 2014-04-25 ENCOUNTER — Other Ambulatory Visit: Payer: Self-pay | Admitting: *Deleted

## 2014-04-25 ENCOUNTER — Telehealth: Payer: Self-pay | Admitting: Neurology

## 2014-04-25 DIAGNOSIS — R51 Headache: Principal | ICD-10-CM

## 2014-04-25 DIAGNOSIS — R519 Headache, unspecified: Secondary | ICD-10-CM

## 2014-04-25 MED ORDER — NORTRIPTYLINE HCL 50 MG PO CAPS: 50.0000 mg | ORAL_CAPSULE | Freq: Every day | ORAL | Status: DC

## 2014-04-25 NOTE — Telephone Encounter (Signed)
Ok to refill 

## 2014-04-25 NOTE — Telephone Encounter (Signed)
Pt called requesting for the script for [facility]  Pharmacy: [facility] on [address]  C/b [phone]

## 2014-06-03 NOTE — Telephone Encounter (Signed)
error 

## 2014-07-03 ENCOUNTER — Ambulatory Visit (INDEPENDENT_AMBULATORY_CARE_PROVIDER_SITE_OTHER): Payer: Self-pay | Admitting: Neurology

## 2014-07-03 ENCOUNTER — Encounter: Payer: Self-pay | Admitting: Neurology

## 2014-07-03 VITALS — BP 130/72 | HR 95 | Resp 16 | Ht 63.5 in | Wt 131.0 lb

## 2014-07-03 DIAGNOSIS — R51 Headache: Secondary | ICD-10-CM

## 2014-07-03 DIAGNOSIS — R519 Headache, unspecified: Secondary | ICD-10-CM

## 2014-07-03 DIAGNOSIS — F411 Generalized anxiety disorder: Secondary | ICD-10-CM

## 2014-07-03 MED ORDER — NORTRIPTYLINE HCL 50 MG PO CAPS: 50.0000 mg | ORAL_CAPSULE | Freq: Every day | ORAL | Status: DC

## 2014-07-03 NOTE — Patient Instructions (Signed)
1. Continue nortriptyline 50mg  at bedtime 2. Call your primary care doctor to discuss anxiety 3. Follow-up in 6 months

## 2014-07-03 NOTE — Progress Notes (Signed)
NEUROLOGY FOLLOW UP OFFICE NOTE  Howard Formbel Loren [id number]  HISTORY OF PRESENT ILLNESS: I had the pleasure of seeing Howard Bender in follow-up in the neurology clinic on 07/03/2014.  The patient was last seen 3 months ago for headaches. He is again accompanied by his friend today who helps supplement the history.  A Spanish medical interpreter helps with translation. On his last visit, he reported head pressure in the occipital region, with positional dizziness on head turn. Nortriptyline dose was increased to 50mg  qhs. No side effects on this dose. He reports that these symptoms have resolved. He reports that he only has some head pressure and dizziness with blurred vision when he has intercourse with his new girlfriend. He feels very anxious, like his "teeth are very tight." He does not sleep well after. His main concern today is anxiety that is affecting his stomach. He denies any focal numbness/tingling/weakness.  HPI: This is a pleasant 24 yo RH man with no significant past medical history in his usually state of health until the past year when he started having left temporal, bilateral retro-orbital and periorbital (above the eyelids) sharp pain with associated photosensitivity. He reports that pain usually occurs when he walks around, he feels better sitting down and after eating. He feels that symptoms started when he began using his cellphone, with his head bent forward. Extending his head back seemed to help. He reported pain occurs daily, with rarely a day without pain. 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 When the pain first started, he was having associated nausea, which was relieved with Zofran. He has stopped taking this. He also started having dizziness described as a brief spinning sensation with head movement, resolving when head is still. He also had black floaters in his vision. He had seen an  optometrist twice and was told vision was okay. He had seen ENT, evaluation unremarkable, and symptoms felt to be due to migraine. He took 2 tablets of Excedrin migraine one time, however had side effects of whole body numbness, leading to an visit [facility] where head CT done which I personally reviewed was unremarkable. He initially reported improvement in symptoms with nortriptyline 30mg  qhs. 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 There is no family history of headaches.  PAST MEDICAL HISTORY: No past medical history on file.  MEDICATIONS: Current Outpatient Prescriptions on File Prior to Visit  Medication Sig Dispense Refill  . nortriptyline (PAMELOR) 50 MG capsule Take 1 capsule (50 mg total) by mouth at bedtime. 30 capsule 11   No current facility-administered medications on file prior to visit.    ALLERGIES: Allergies  Allergen Reactions  . Phenergan [Promethazine Hcl] Anxiety and Palpitations  . Caffeine Nausea And Vomiting    FAMILY HISTORY: Family History  Problem Relation Age of Onset  . Diabetes Mother   . High blood pressure Mother   . Cancer Maternal Grandmother   . Prostate cancer Maternal Grandfather     SOCIAL HISTORY: History   Social History  . Marital Status: Married    Spouse Name: N/A  . Number of Children: 1  . Years of Education: N/A   Occupational History  . cook    Social History Main Topics  . Smoking status: Never Smoker   . Smokeless tobacco: Never Used  . Alcohol Use: No  .  Drug Use: No  . Sexual Activity: Not on file   Other Topics Concern  . Not on file   Social History Narrative   ** Merged History Encounter **        REVIEW OF SYSTEMS: Constitutional: No fevers, chills, or sweats, no generalized fatigue, change in appetite Eyes: No visual changes, double vision, eye pain Ear, nose and throat: No hearing loss, ear pain, nasal congestion, sore throat Cardiovascular: No chest  pain, palpitations Respiratory:  No shortness of breath at rest or with exertion, wheezes GastrointestinaI: No nausea, vomiting, diarrhea, abdominal pain, fecal incontinence Genitourinary:  No dysuria, urinary retention or frequency Musculoskeletal:  No neck pain, back pain Integumentary: No rash, pruritus, skin lesions Neurological: as above Psychiatric: No depression, insomnia,+ anxiety Endocrine: No palpitations, fatigue, diaphoresis, mood swings, change in appetite, change in weight, increased thirst Hematologic/Lymphatic:  No anemia, purpura, petechiae. Allergic/Immunologic: no itchy/runny eyes, nasal congestion, recent allergic reactions, rashes  PHYSICAL EXAM: Filed Vitals:   07/03/14 0841  BP: 130/72  Pulse: 95  Resp: 16   General: No acute distress Head:  Normocephalic/atraumatic Neck: supple, no paraspinal tenderness, full range of motion Heart:  Regular rate and rhythm Lungs:  Clear to auscultation bilaterally Back: No paraspinal tenderness Skin/Extremities: No rash, no edema Neurological Exam: alert and oriented to person, place, and time. No aphasia or dysarthria. Fund of knowledge is appropriate.  Recent and remote memory are intact.  Attention and concentration are normal.    Able to name objects and repeat phrases. Cranial nerves: Pupils equal, round, reactive to light.  Fundoscopic exam unremarkable, no papilledema. Extraocular movements intact with no nystagmus. Visual fields full. Facial sensation intact. No facial asymmetry. Tongue, uvula, palate midline.  Motor: Bulk and tone normal, muscle strength 5/5 throughout with no pronator drift.  Sensation to light touch intact.  No extinction to double simultaneous stimulation.  Deep tendon reflexes 2+ throughout, toes downgoing.  Finger to nose testing intact.  Gait narrow-based and steady, able to tandem walk adequately.  Romberg negative.  IMPRESSION: This is a pleasant 24 yo RH man who presented with chronic daily  headaches in 2015. Neurological exam and head CT normal. Headaches have resolved with nortriptyline  qhs. His main concern today is anxiety, that occurs after intercourse with his new girlfriend, causing him to have slight dizziness and blurred vision. He will discuss anxiety treatment with his PCP. Continue nortriptyline  qhs. He will follow-up in 6 months and knows to call our office for any problems.   Thank you for allowing me to participate in his care.  Please do not hesitate to call for any questions or concerns.  The duration of this appointment visit was 15 minutes of face-to-face time with the patient.  Greater than 50% of this time was spent in counseling, explanation of diagnosis, planning of further management, and coordination of care.   Patrcia Dolly, M.D.   CC: Dr. Lonzo Cloud

## 2014-07-08 DIAGNOSIS — R519 Headache, unspecified: Secondary | ICD-10-CM | POA: Insufficient documentation

## 2014-07-08 DIAGNOSIS — F411 Generalized anxiety disorder: Secondary | ICD-10-CM | POA: Insufficient documentation

## 2014-07-08 DIAGNOSIS — R51 Headache: Principal | ICD-10-CM

## 2015-01-05 ENCOUNTER — Ambulatory Visit: Payer: Self-pay | Admitting: Neurology

## 2015-01-06 ENCOUNTER — Ambulatory Visit: Payer: Self-pay | Admitting: Neurology

## 2015-01-15 ENCOUNTER — Ambulatory Visit: Payer: Self-pay | Admitting: Neurology

## 2015-01-23 ENCOUNTER — Ambulatory Visit (INDEPENDENT_AMBULATORY_CARE_PROVIDER_SITE_OTHER): Payer: Self-pay | Admitting: Neurology

## 2015-01-23 ENCOUNTER — Encounter: Payer: Self-pay | Admitting: Neurology

## 2015-01-23 VITALS — BP 128/70 | HR 104 | Resp 16 | Ht 63.5 in | Wt 139.0 lb

## 2015-01-23 DIAGNOSIS — G43009 Migraine without aura, not intractable, without status migrainosus: Secondary | ICD-10-CM

## 2015-01-23 DIAGNOSIS — H8110 Benign paroxysmal vertigo, unspecified ear: Secondary | ICD-10-CM

## 2015-01-23 DIAGNOSIS — F411 Generalized anxiety disorder: Secondary | ICD-10-CM

## 2015-01-23 MED ORDER — NORTRIPTYLINE HCL 10 MG PO CAPS: ORAL_CAPSULE | ORAL | Status: DC

## 2015-01-23 NOTE — Patient Instructions (Signed)
1. Reduce nortriptyline to  daily 2. Discuss nervousness with your family doctor 3. Call our office for any changes, follow-up in 6 months

## 2015-01-23 NOTE — Progress Notes (Signed)
NEUROLOGY FOLLOW UP OFFICE NOTE  Howard Bender [id number]  HISTORY OF PRESENT ILLNESS: I had the pleasure of seeing Howard Bender in follow-up in the neurology clinic on 01/23/2015.  The patient was last seen 7 months ago for headaches. He is again accompanied by his friend today who helps supplement the history. A Spanish medical interpreter helps with translation. He continues to have good response to nortriptyline  qhs, reporting that the headaches he initially presented with have resolved without recurrence. His main concern today continues to be dizziness. He reports dizziness occurs when he feels nervous, or when he is turning his head quickly at work. He has noticed that if he eats something, the dizziness goes away. He is also stating that when he has sexual intercourse, the dizziness goes away. He is concerned about some sexual side effects from nortriptyline. He had been advised to discuss anxiety with his PCP on his last visit, he has not done this yet, and tells me today that he was prescribed medication for anxiety in the past, which did not help. He denies any diplopia, nausea, vomiting, focal numbness/tingling/weakness. No falls.  HPI: This is a pleasant 24 yo RH man with no significant past medical history in his usually state of health until the past year when he started having left temporal, bilateral retro-orbital and periorbital (above the eyelids) sharp pain with associated photosensitivity. He reports that pain usually occurs when he walks around, he feels better sitting down and after eating. He feels that symptoms started when he began using his cellphone, with his head bent forward. Extending his head back seemed to help. He reported pain occurs daily, with rarely a day without pain. 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 When the pain first started, he was having associated nausea, which  was relieved with Zofran. He has stopped taking this. He also started having dizziness described as a brief spinning sensation with head movement, resolving when head is still. He also had black floaters in his vision. He had seen an optometrist twice and was told vision was okay. He had seen ENT, evaluation unremarkable, and symptoms felt to be due to migraine. He took 2 tablets of Excedrin migraine one time, however had side effects of whole body numbness, leading to an visit to [facility] ER last 08/2013 where head CT done which I personally reviewed was unremarkable. He initially reported improvement in symptoms with nortriptyline  qhs. 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 There is no family history of headaches.  PAST MEDICAL HISTORY: No past medical history on file.  MEDICATIONS: Current Outpatient Prescriptions on File Prior to Visit  Medication Sig Dispense Refill  . nortriptyline (PAMELOR) 50 MG capsule Take 1 capsule (50 mg total) by mouth at bedtime. 30 capsule 11   No current facility-administered medications on file prior to visit.    ALLERGIES: Allergies  Allergen Reactions  . Phenergan [Promethazine Hcl] Anxiety and Palpitations  . Caffeine Nausea And Vomiting    FAMILY HISTORY: Family History  Problem Relation Age of Onset  . Diabetes Mother   . High blood pressure Mother   . Cancer Maternal Grandmother   . Prostate cancer Maternal Grandfather     SOCIAL HISTORY: Social History   Social History  . Marital Status: Married    Spouse Name: N/A  . Number of Children:  1  . Years of Education: N/A   Occupational History  . cook    Social History Main Topics  . Smoking status: Never Smoker   . Smokeless tobacco: Never Used  . Alcohol Use: No  . Drug Use: No  . Sexual Activity: Not on file   Other Topics Concern  . Not on file   Social History Narrative   ** Merged History Encounter **        REVIEW OF  SYSTEMS: Constitutional: No fevers, chills, or sweats, no generalized fatigue, change in appetite Eyes: No visual changes, double vision, eye pain Ear, nose and throat: No hearing loss, ear pain, nasal congestion, sore throat Cardiovascular: No chest pain, palpitations Respiratory:  No shortness of breath at rest or with exertion, wheezes GastrointestinaI: No nausea, vomiting, diarrhea, abdominal pain, fecal incontinence Genitourinary:  No dysuria, urinary retention or frequency Musculoskeletal:  No neck pain, back pain Integumentary: No rash, pruritus, skin lesions Neurological: as above Psychiatric: No depression, insomnia, anxiety Endocrine: No palpitations, fatigue, diaphoresis, mood swings, change in appetite, change in weight, increased thirst Hematologic/Lymphatic:  No anemia, purpura, petechiae. Allergic/Immunologic: no itchy/runny eyes, nasal congestion, recent allergic reactions, rashes  PHYSICAL EXAM: Filed Vitals:   01/23/15 1608  BP: 128/70  Pulse: 104  Resp: 16   General: No acute distress Head:  Normocephalic/atraumatic Neck: supple, no paraspinal tenderness, full range of motion Heart:  Regular rate and rhythm Lungs:  Clear to auscultation bilaterally Back: No paraspinal tenderness Skin/Extremities: No rash, no edema Neurological Exam: alert and oriented to person, place, and time. No aphasia or dysarthria. Fund of knowledge is appropriate.  Recent and remote memory are intact.  Attention and concentration are normal.    Able to name objects and repeat phrases. Cranial nerves: Pupils equal, round, reactive to light.  Fundoscopic exam unremarkable, no papilledema. Extraocular movements intact with no nystagmus. Visual fields full. Facial sensation intact. No facial asymmetry. Tongue, uvula, palate midline.  Motor: Bulk and tone normal, muscle strength 5/5 throughout with no pronator drift.  Sensation to light touch intact.  No extinction to double simultaneous  stimulation.  Deep tendon reflexes 2+ throughout, toes downgoing.  Finger to nose testing intact.  Gait narrow-based and steady, able to tandem walk adequately.  Romberg negative.  IMPRESSION: This is a pleasant 24 yo RH man who presented with chronic daily headaches in 2015. Neurological exam and head CT normal. Headaches have resolved with nortriptyline  qhs with no recurrence. His main concern today is dizziness that he reports occurs when he is nervous or when at work. He had been advised in the past to discuss treatment of anxiety with his PCP, he will call them today. He is concerned about sexual side effects from nortriptyline and will reduce dose to  qhs, continuing to monitor for headache recurrence with reduction in dose. He knows to call our office for any problems and will follow-up in 6 months.   Thank you for allowing me to participate in his care.  Please do not hesitate to call for any questions or concerns.  The duration of this appointment visit was 15 minutes of face-to-face time with the patient.  Greater than 50% of this time was spent in counseling, explanation of diagnosis, planning of further management, and coordination of care.   Patrcia Dolly, M.D.   CC: Dr. Lonzo Cloud

## 2015-02-20 ENCOUNTER — Encounter (HOSPITAL_COMMUNITY): Payer: Self-pay | Admitting: *Deleted

## 2015-02-20 ENCOUNTER — Emergency Department (INDEPENDENT_AMBULATORY_CARE_PROVIDER_SITE_OTHER)
Admission: EM | Admit: 2015-02-20 | Discharge: 2015-02-20 | Disposition: A | Discharge status: Home / Self Care | Payer: No Typology Code available for payment source | Source: Home / Self Care | Attending: Family Medicine | Admitting: Family Medicine

## 2015-02-20 DIAGNOSIS — L600 Ingrowing nail: Secondary | ICD-10-CM

## 2015-02-20 MED ORDER — BUPIVACAINE HCL (PF) 0.5 % IJ SOLN
INTRAMUSCULAR | Status: AC
  Filled 2015-02-20: qty 10

## 2015-02-20 NOTE — ED Provider Notes (Signed)
CSN: [id number]     Arrival date & time 02/20/15  2004 History   First MD Initiated Contact with Patient 02/20/15 2008     Chief Complaint  Patient presents with  . Toe Injury   (Consider location/radiation/quality/duration/timing/severity/associated sxs/prior Treatment) Patient is a 24 y.o. male presenting with toe pain. The history is provided by the patient. The history is limited by a language barrier. Language interpreter used: adult male trans.  Toe Pain This is a new problem. The current episode started more than 1 week ago. The problem has been gradually worsening.    History reviewed. No pertinent past medical history. History reviewed. No pertinent past surgical history. History reviewed. No pertinent family history. Social History  Substance Use Topics  . Smoking status: Current Every Day Smoker    Types: Cigarettes  . Smokeless tobacco: None  . Alcohol Use: No    Review of Systems  Musculoskeletal: Positive for gait problem.  Skin: Positive for wound.  All other systems reviewed and are negative.   Allergies  Review of patient's allergies indicates no known allergies.  Home Medications   Prior to Admission medications   Medication Sig Start Date End Date Taking? Authorizing Provider  amoxicillin (AMOXIL) 500 MG capsule Take 1,000 mg by mouth 2 (two) times daily. Prescribed on 08-19-13 however patient states that he just finished on 09-09-13    Historical Provider, MD  aspirin-acetaminophen-caffeine (EXCEDRIN MIGRAINE) [phone] MG per tablet Take 1 tablet by mouth every 6 (six) hours as needed for headache.    Historical Provider, MD  aspirin-acetaminophen-caffeine (EXCEDRIN MIGRAINE) [phone] MG per tablet Take 2 tablets by mouth every 6 (six) hours as needed for headache.    Historical Provider, MD  clarithromycin (BIAXIN) 500 MG tablet Take 500 mg by mouth 2 (two) times daily. Prescribed 08-19-13 for 10 days however patient finished on 09-09-13    Historical  Provider, MD  meclizine (ANTIVERT) 25 MG tablet Take 25 mg by mouth 3 (three) times daily as needed for dizziness.    Historical Provider, MD  omeprazole (PRILOSEC) 20 MG capsule Take 20 mg by mouth daily.    Historical Provider, MD   Meds Ordered and Administered this Visit  Medications - No data to display  There were no vitals taken for this visit. No data found.   Physical Exam  Constitutional: He is oriented to person, place, and time. He appears well-developed and well-nourished. No distress.  Musculoskeletal: He exhibits tenderness.       Feet:  Neurological: He is alert and oriented to person, place, and time.  Skin: Skin is warm and dry. No erythema.  Vitals reviewed.   ED Course  .Nail Removal Date/Time: 02/20/2015 8:41 PM Performed by: Linna Hoff Authorized by: Bradd Canary D Consent: Verbal consent obtained. Consent given by: patient Patient understanding: patient states understanding of the procedure being performed Location: right foot Location details: right big toe Anesthesia: local infiltration Local anesthetic: bupivacaine 0.5% without epinephrine Patient sedated: no Preparation: skin prepped with alcohol Amount removed: 1/3 Side: ulnar Nail bed sutured: no Nail matrix removed: none Removed nail replaced and anchored: no Dressing: Xeroform gauze Patient tolerance: Patient tolerated the procedure well with no immediate complications   (including critical care time)  Labs Review Labs Reviewed - No data to display  Imaging Review No results found.   Visual Acuity Review  Right Eye Distance:   Left Eye Distance:   Bilateral Distance:    Right Eye Near:   Left Eye  Near:    Bilateral Near:         MDM   1. Ingrown right greater toenail    Partial nail excision of ingrown toenail.    Linna HoffJames D Kindl, MD 02/20/15 [phone]

## 2015-02-20 NOTE — Discharge Instructions (Signed)
Warm soak and bacitracin and advil as needed. Cut nail correctly  as instructed.

## 2015-02-20 NOTE — ED Notes (Signed)
Pt  Reports    Ingrown  r  Big  Toe       Nail     For     1  Week    Pt reports   Pain in the  Big  Toe          Pt  Ambulating       With a  Slow  Steady  Gait

## 2015-04-17 IMAGING — CT CT HEAD W/O CM
2 series · 17 of 30 positions shown, 20 images · non-contrast
Comparison: None.

CLINICAL DATA: Dizziness and headache

EXAM:
CT HEAD WITHOUT CONTRAST
TECHNIQUE: Contiguous axial images were obtained from the base of the skull
through the vertex without intravenous contrast.

[Series 2: head w/o · axial · non-contrast · 0.46mm/px · z∈[-139,-19]mm · 9 of 31 slices shown, 12 images]
[im 4/31  brain]
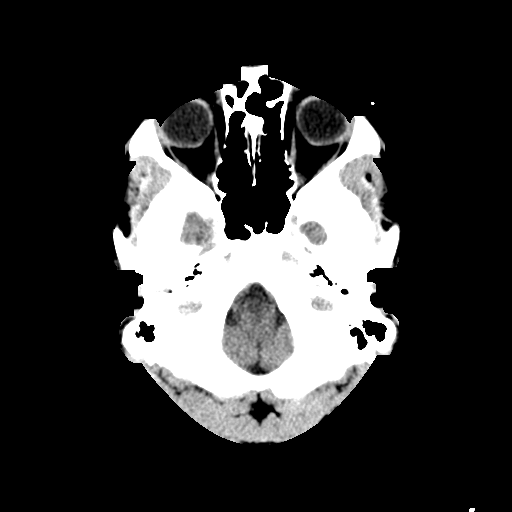
[im 4/31  bone]
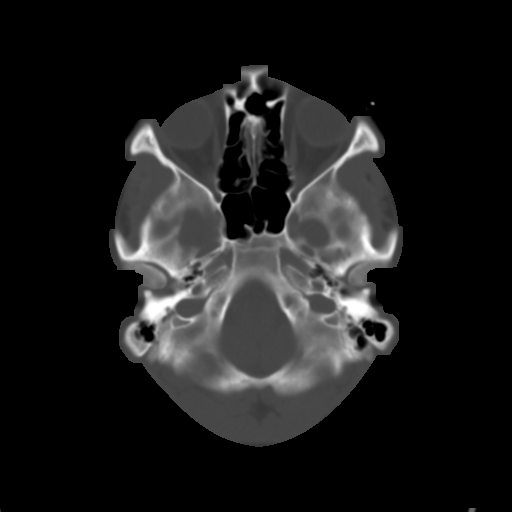
[im 7/31  brain]
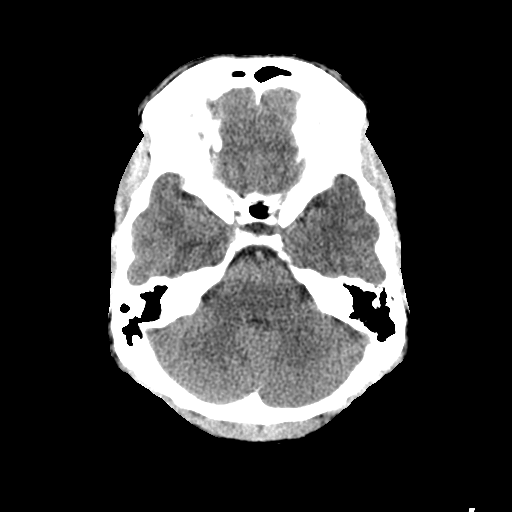
[im 10/31  brain]
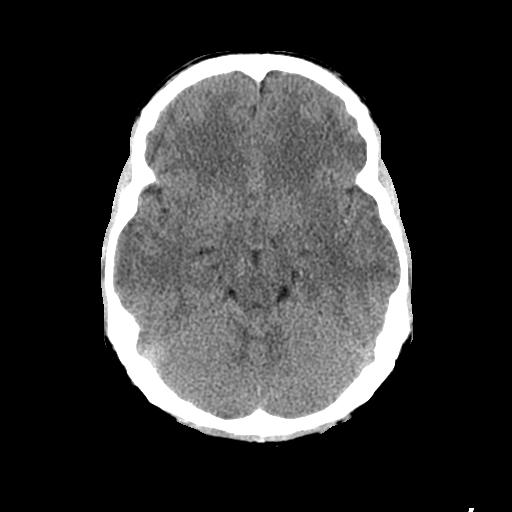
[im 13/31  brain]
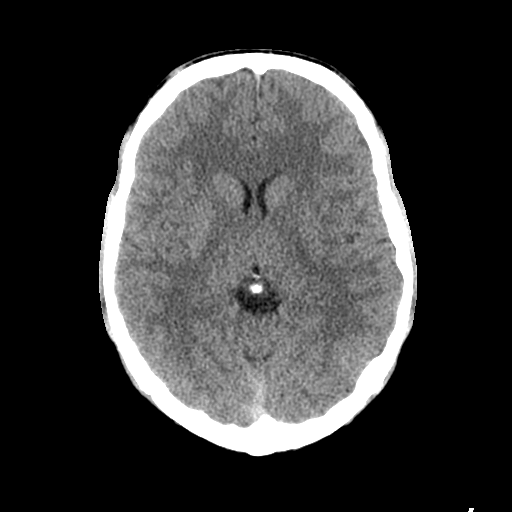
[im 16/31  brain]
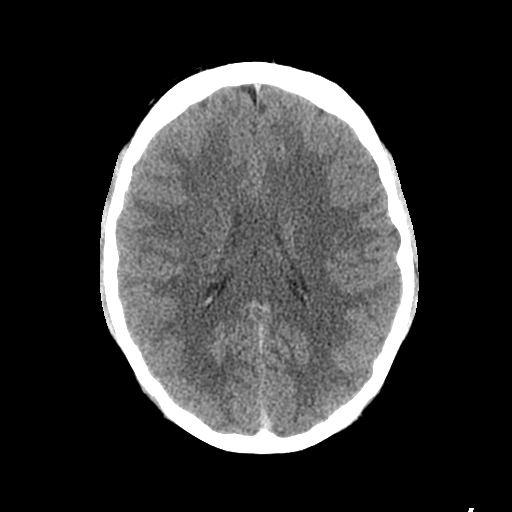
[im 16/31  bone]
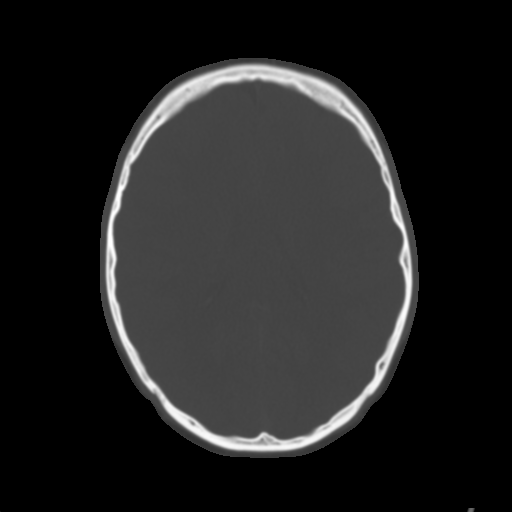
[im 19/31  brain]
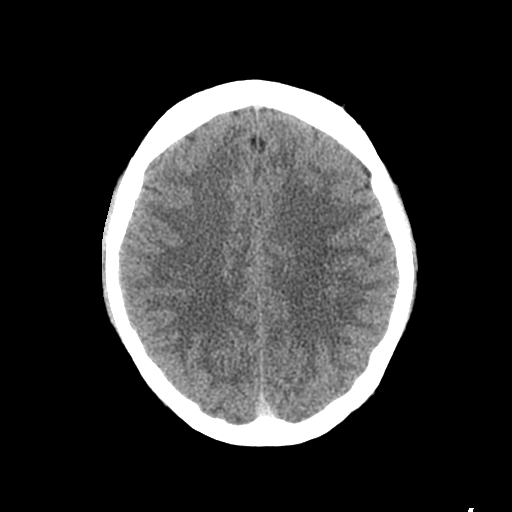
[im 22/31  brain]
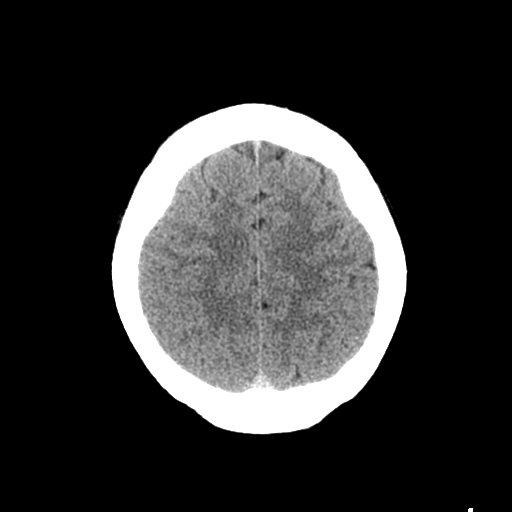
[im 25/31  brain]
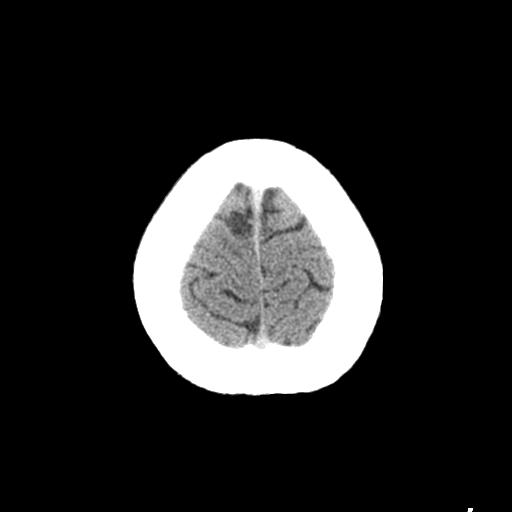
[im 28/31  brain]
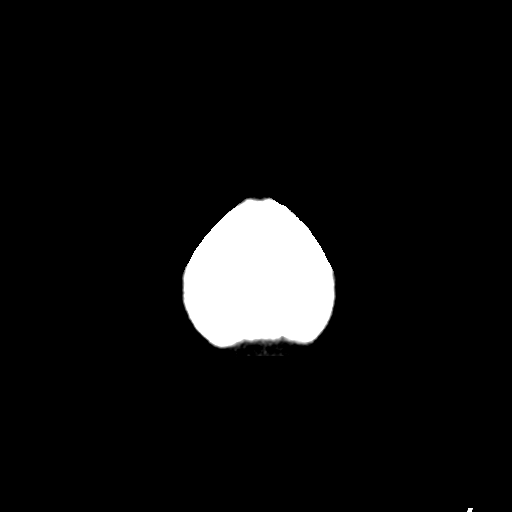
[im 28/31  bone]
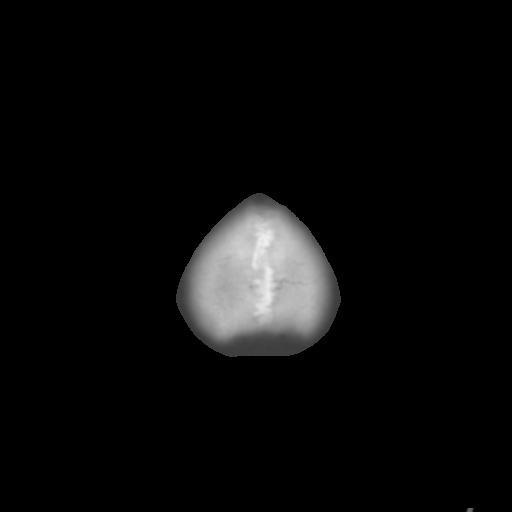

[Series 3: bone windows · axial · 0.46mm/px · z∈[-139,-19]mm · 8 of 52 slices shown]
[im 6/52  bone]
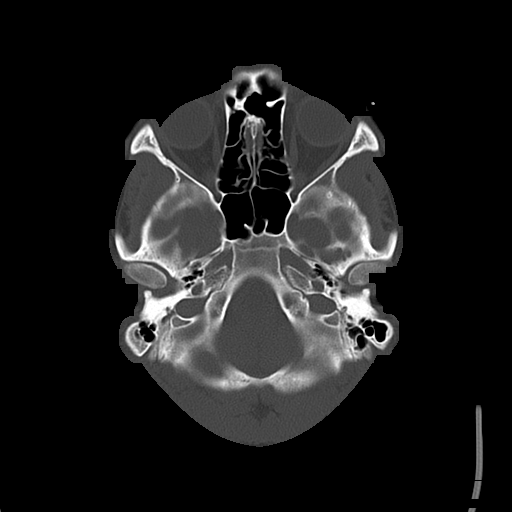
[im 12/52  bone]
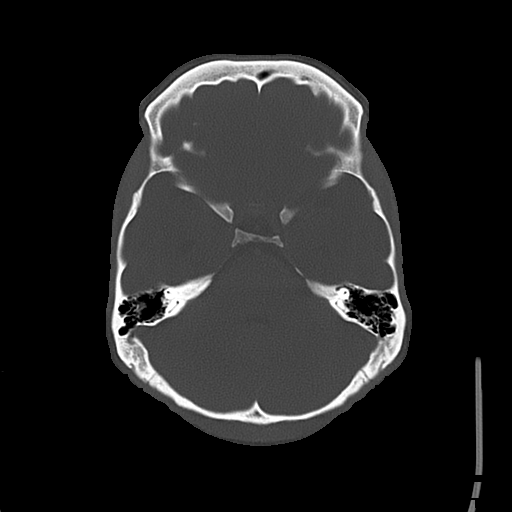
[im 18/52  bone]
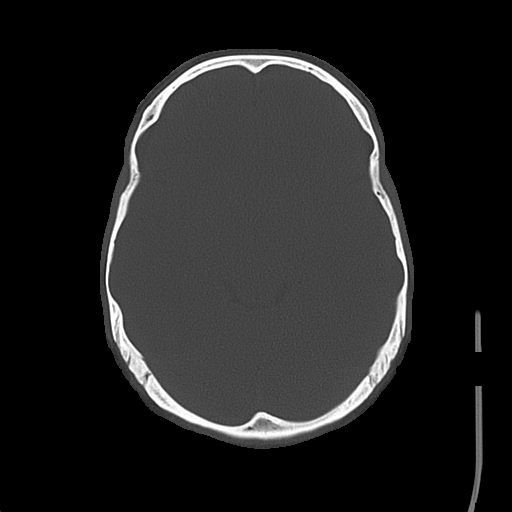
[im 23/52  bone]
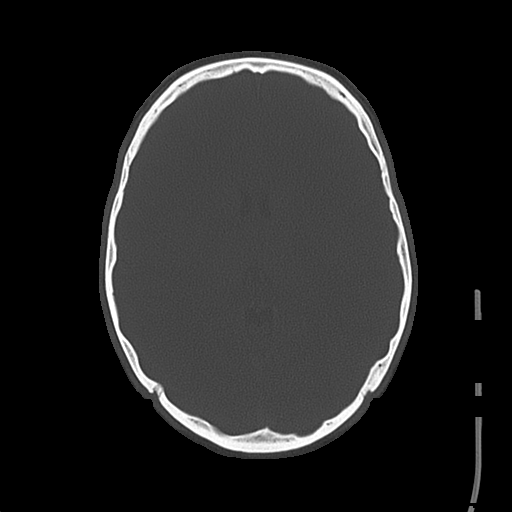
[im 29/52  bone]
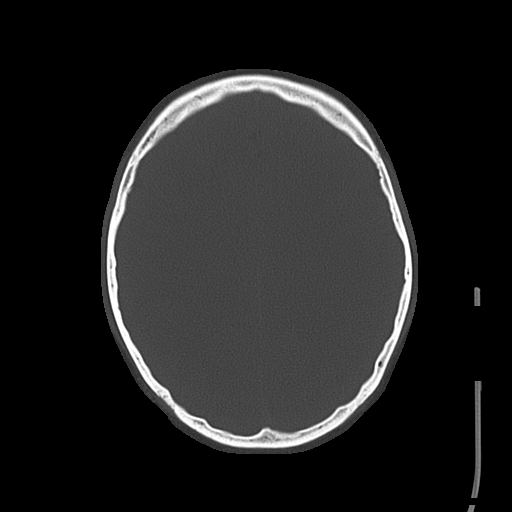
[im 35/52  bone]
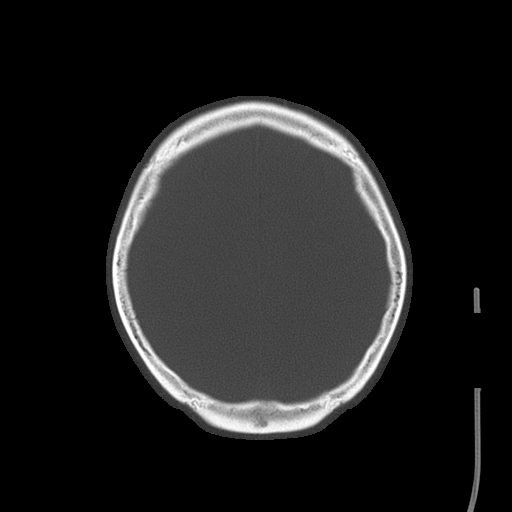
[im 40/52  bone]
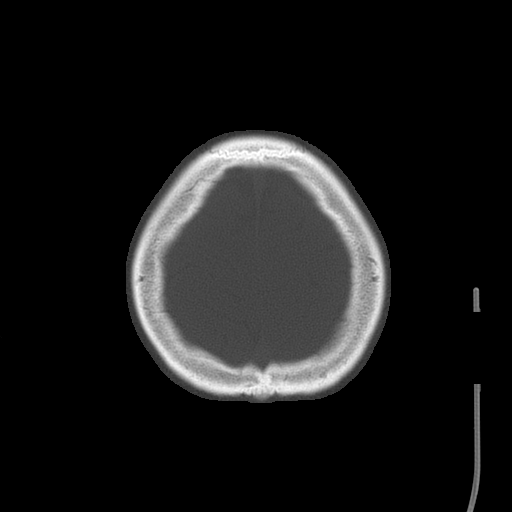
[im 46/52  bone]
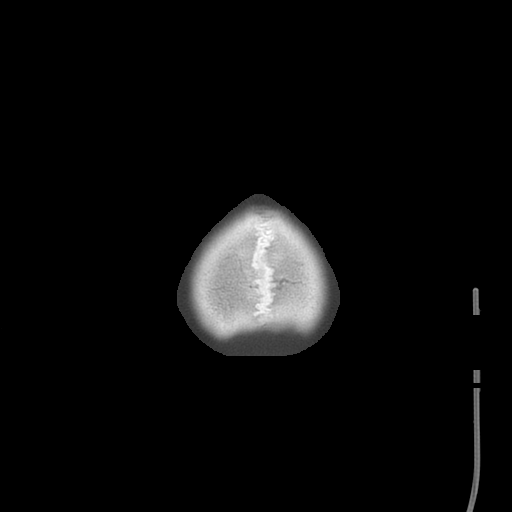

[17 of 30 positions shown; findings below may reference images not displayed]

FINDINGS: Ventricles are normal in size and configuration. No parenchymal
masses or mass effect. No areas of abnormal parenchymal attenuation.
No extra-axial masses or abnormal fluid collections. There is no
intracranial hemorrhage.

Visualized sinuses and mastoid air cells are clear. No skull lesion.
IMPRESSION: Normal unenhanced CT scan of the brain.

## 2015-07-20 ENCOUNTER — Other Ambulatory Visit: Payer: Self-pay | Admitting: Neurology

## 2015-07-21 ENCOUNTER — Telehealth: Payer: Self-pay | Admitting: Neurology

## 2015-07-21 NOTE — Telephone Encounter (Signed)
Returned call. Patient refills were sent to pharmacy on yesterday. We will patient in the office on Monday for his f/u.

## 2015-07-21 NOTE — Telephone Encounter (Signed)
PT called in regards to his medication/Dawn CB# [phone]

## 2015-07-21 NOTE — Telephone Encounter (Signed)
Aquino patient.  

## 2015-07-27 ENCOUNTER — Encounter: Payer: Self-pay | Admitting: Neurology

## 2015-07-27 ENCOUNTER — Ambulatory Visit (INDEPENDENT_AMBULATORY_CARE_PROVIDER_SITE_OTHER): Payer: Self-pay | Admitting: Neurology

## 2015-07-27 VITALS — BP 110/68 | HR 74 | Resp 14 | Wt 122.0 lb

## 2015-07-27 DIAGNOSIS — R42 Dizziness and giddiness: Secondary | ICD-10-CM

## 2015-07-27 DIAGNOSIS — G43009 Migraine without aura, not intractable, without status migrainosus: Secondary | ICD-10-CM

## 2015-07-27 MED ORDER — NORTRIPTYLINE HCL 50 MG PO CAPS: ORAL_CAPSULE | ORAL | Status: DC

## 2015-07-27 NOTE — Patient Instructions (Signed)
1. Restart nortriptyline 50mg : take 1 capsule daily 2. Follow-up in 6 months, call for any changes

## 2015-07-27 NOTE — Progress Notes (Signed)
NEUROLOGY FOLLOW UP OFFICE NOTE  Montavius Subramaniam [id number]  HISTORY OF PRESENT ILLNESS: I had the pleasure of seeing Zoey Gilkeson in follow-up in the neurology clinic on 07/27/2015.  The patient was last seen 24 months ago for headaches. He is again accompanied by his friend today who helps supplement the history. A Spanish medical interpreter helps with translation. On his last visit, he reported good response to nortriptyline  qhs with no further headaches. He however continued to report dizziness that occurred when nervous or turning his head quickly. He was advised to speak to his PCP about the anxiety. He expressed concern for sexual side effects on nortriptyline and dose was reduced to  qhs (  caps 4 caps qhs). He however misunderstood instructions and had only been initially taking 1 capsule at night. On this dose, he was having a little headache behind his eyes, as well as daily dizziness. He increased dose to  qhs and would like to resume  dose because he felt much better on this. He has occasional blurred vision, he denies any neck/back pain, no focal numbness/tingling/weakness. He reports the anxiety has resolved. No falls.   HPI: This is a pleasant 25 yo RH man with no significant past medical history in his usually state of health until the past year when he started having left temporal, bilateral retro-orbital and periorbital (above the eyelids) sharp pain with associated photosensitivity. He reports that pain usually occurs when he walks around, he feels better sitting down and after eating. He feels that symptoms started when he began using his cellphone, with his head bent forward. Extending his head back seemed to help. He reported pain occurs daily, with rarely a day without pain. 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 When the pain first started, he was having associated nausea,  which was relieved with Zofran. He has stopped taking this. He also started having dizziness described as a brief spinning sensation with head movement, resolving when head is still. He also had black floaters in his vision. He had seen an optometrist twice and was told vision was okay. He had seen ENT, evaluation unremarkable, and symptoms felt to be due to migraine. He took 2 tablets of Excedrin migraine one time, however had side effects of whole body numbness, leading to an visit to [facility] ER last 08/2013 where head CT done which I personally reviewed was unremarkable. He initially reported improvement in symptoms with nortriptyline  qhs. 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 There is no family history of headaches.  PAST MEDICAL HISTORY: History reviewed. No pertinent past medical history.  MEDICATIONS: Current Outpatient Prescriptions on File Prior to Visit  Medication Sig Dispense Refill  . nortriptyline (PAMELOR) 10 MG capsule Take 4 caps at night (Patient taking differently: taking 2 caps at night) 120 capsule 5   No current facility-administered medications on file prior to visit.    ALLERGIES: Allergies  Allergen Reactions  . Phenergan [Promethazine Hcl] Anxiety and Palpitations  . Caffeine Nausea And Vomiting    FAMILY HISTORY: Family History  Problem Relation Age of Onset  . Diabetes Mother   . High blood pressure Mother   . Cancer Maternal Grandmother   . Prostate cancer Maternal Grandfather     SOCIAL HISTORY: Social History   Social History  . Marital Status: Married  Spouse Name: N/A  . Number of Children: 1  . Years of Education: N/A   Occupational History  . cook    Social History Main Topics  . Smoking status: Never Smoker   . Smokeless tobacco: Never Used  . Alcohol Use: No  . Drug Use: No  . Sexual Activity: Not on file   Other Topics Concern  . Not on file   Social History Narrative   ** Merged  History Encounter **        REVIEW OF SYSTEMS: Constitutional: No fevers, chills, or sweats, no generalized fatigue, change in appetite Eyes: No visual changes, double vision, eye pain Ear, nose and throat: No hearing loss, ear pain, nasal congestion, sore throat Cardiovascular: No chest pain, palpitations Respiratory:  No shortness of breath at rest or with exertion, wheezes GastrointestinaI: No nausea, vomiting, diarrhea, abdominal pain, fecal incontinence Genitourinary:  No dysuria, urinary retention or frequency Musculoskeletal:  No neck pain, back pain Integumentary: No rash, pruritus, skin lesions Neurological: as above Psychiatric: No depression, insomnia, anxiety Endocrine: No palpitations, fatigue, diaphoresis, mood swings, change in appetite, change in weight, increased thirst Hematologic/Lymphatic:  No anemia, purpura, petechiae. Allergic/Immunologic: no itchy/runny eyes, nasal congestion, recent allergic reactions, rashes  PHYSICAL EXAM: Filed Vitals:   07/27/15 1006  BP: 110/68  Pulse: 74  Resp: 14   General: No acute distress Head:  Normocephalic/atraumatic Neck: supple, no paraspinal tenderness, full range of motion Heart:  Regular rate and rhythm Lungs:  Clear to auscultation bilaterally Back: No paraspinal tenderness Skin/Extremities: No rash, no edema Neurological Exam: alert and oriented to person, place, and time. No aphasia or dysarthria. Fund of knowledge is appropriate.  Recent and remote memory are intact.  Attention and concentration are normal.    Able to name objects and repeat phrases. Cranial nerves: Pupils equal, round, reactive to light. Extraocular movements intact with no nystagmus. Visual fields full. Facial sensation intact. No facial asymmetry. Tongue, uvula, palate midline.  Motor: Bulk and tone normal, muscle strength 5/5 throughout with no pronator drift.  Sensation to light touch intact.  No extinction to double simultaneous stimulation.  Deep  tendon reflexes 2+ throughout, toes downgoing.  Finger to nose testing intact.  Gait narrow-based and steady, able to tandem walk adequately.  Romberg negative.  IMPRESSION: This is a pleasant 25 yo RH man who presented with chronic daily headaches in 2015. Neurological exam and head CT normal. Headaches have resolved with nortriptyline 50mg  qhs. On his last visit, he was concerned about sexual side effects and requested lower dose, dose reduced to 40mg  qhs but patient misunderstood and was only taking 10mg  then 20mg  qhs. He reports dizziness is now daily and mild headaches have come back, he would like to resume 50mg  qhs dose. Refills sent. He reports anxiety has resolved. He will follow-up in 6 months and knows to call our office for any problems in the interim.  Thank you for allowing me to participate in his care.  Please do not hesitate to call for any questions or concerns.  The duration of this appointment visit was 15 minutes of face-to-face time with the patient.  Greater than 50% of this time was spent in counseling, explanation of diagnosis, planning of further management, and coordination of care.   Patrcia DollyKaren Aquino, M.D.   CC: Dr. Lonzo CloudShamleffer

## 2015-11-16 ENCOUNTER — Encounter (HOSPITAL_COMMUNITY): Payer: Self-pay | Admitting: Emergency Medicine

## 2015-11-16 ENCOUNTER — Emergency Department (HOSPITAL_COMMUNITY)
Admission: EM | Admit: 2015-11-16 | Discharge: 2015-11-16 | Disposition: A | Discharge status: Home / Self Care | Payer: No Typology Code available for payment source | Attending: Emergency Medicine | Admitting: Emergency Medicine

## 2015-11-16 DIAGNOSIS — R000 Tachycardia, unspecified: Secondary | ICD-10-CM | POA: Insufficient documentation

## 2015-11-16 DIAGNOSIS — T50905A Adverse effect of unspecified drugs, medicaments and biological substances, initial encounter: Secondary | ICD-10-CM

## 2015-11-16 DIAGNOSIS — F17210 Nicotine dependence, cigarettes, uncomplicated: Secondary | ICD-10-CM | POA: Insufficient documentation

## 2015-11-16 DIAGNOSIS — T407X1A Poisoning by cannabis (derivatives), accidental (unintentional), initial encounter: Secondary | ICD-10-CM | POA: Insufficient documentation

## 2015-11-16 LAB — I-STAT CHEM 8, ED
Calcium, Ion: 1.15 mmol/L (ref 1.13–1.30)
Creatinine, Ser: 0.9 mg/dL (ref 0.61–1.24)
Glucose, Bld: 118 mg/dL — ABNORMAL HIGH (ref 65–99)
Hemoglobin: 14.3 g/dL (ref 13.0–17.0)
Potassium: 3.7 mmol/L (ref 3.5–5.1)
Sodium: 139 mmol/L (ref 135–145)

## 2015-11-16 MED ORDER — SODIUM CHLORIDE 0.9 % IV BOLUS (SEPSIS)
1000.0000 mL | Freq: Once | INTRAVENOUS | Status: AC
  Administered 2015-11-16: 1000 mL via INTRAVENOUS

## 2015-11-16 NOTE — Discharge Instructions (Signed)
Do not smoke marijuana or use illegal drugs.  Do not drink caffeinated beverages.

## 2015-11-16 NOTE — ED Triage Notes (Signed)
Per EMS- pt smoked some marijuana and began to have an increased heart rate. HR 131. No other symptoms.

## 2015-11-16 NOTE — ED Provider Notes (Signed)
[facility]EMERGENCY DEPT Provider Note   CSN: [id number] Arrival date & time: 11/16/15  1456  First Provider Contact:  First MD Initiated Contact with Patient 11/16/15 1611        History   Chief Complaint Chief Complaint  Patient presents with  . Tachycardia    HPI Howard Bender is a 25 y.o. male.  The history is provided by the patient. A language interpreter was used.    Howard Bender is a 25 y.o. male who presents to the Emergency Department complaining of rapid heart rate.  He reports 2 hours prior to ED arrival that he notes marijuana and had associated feeling of rapid heartbeat and dry mouth. He has had similar symptoms previously when he drank a red bull. No strength and marijuana and he is not sure if it is laced with anything. Currently his symptoms are improving. He denies a chest pain, shortness of breath, vomiting, numbness, weakness. Symptoms are moderate, constant, improving.  History reviewed. No pertinent past medical history.  There are no active problems to display for this patient.   No past surgical history on file.     Home Medications    Prior to Admission medications   Medication Sig Start Date End Date Taking? Authorizing Provider  nortriptyline (PAMELOR) 50 MG capsule Take 50 mg by mouth at bedtime.   Yes Historical Provider, MD    Family History No family history on file.  Social History Social History  Substance Use Topics  . Smoking status: Current Every Day Smoker    Types: Cigarettes  . Smokeless tobacco: Not on file  . Alcohol use No     Allergies   Caffeine   Review of Systems Review of Systems  All other systems reviewed and are negative.    Physical Exam Updated Vital Signs BP 121/66   Pulse 112   Temp 98.9 F (37.2 C) (Oral)   Resp 16   SpO2 97%   Physical Exam  Constitutional: He is oriented to person, place, and time. He appears well-developed and well-nourished.  HENT:  Head: Normocephalic and atraumatic.  Eyes:   Pupils are midsize and reactive bilaterally  Cardiovascular: Regular rhythm.   No murmur heard. Tachycardic at a rate of 110-114  Pulmonary/Chest: Effort normal and breath sounds normal. No respiratory distress.  Abdominal: Soft. There is no tenderness. There is no rebound and no guarding.  Musculoskeletal: He exhibits no edema or tenderness.  Neurological: He is alert and oriented to person, place, and time.  Skin: Skin is warm and dry.  Psychiatric: He has a normal mood and affect. His behavior is normal.  Nursing note and vitals reviewed.    ED Treatments / Results  Labs (all labs ordered are listed, but only abnormal results are displayed) Labs Reviewed  I-STAT CHEM 8, ED - Abnormal; Notable for the following:       Result Value   Glucose, Bld 118 (*)    All other components within normal limits    EKG  EKG Interpretation  Date/Time:  Monday November 16 2015 15:18:22 EDT Ventricular Rate:  121 PR Interval:    QRS Duration: 84 QT Interval:  287 QTC Calculation: 408 R Axis:   88 Text Interpretation:  Sinus tachycardia Confirmed by Lincoln Brigham [phone]) on 11/16/2015 4:09:19 PM       Radiology No results found.  Procedures Procedures (including critical care time)  Medications Ordered in ED Medications  sodium chloride 0.9 % bolus 1,000 mL (0 mLs Intravenous Stopped  11/16/15 1623)     Initial Impression / Assessment and Plan / ED Course  I have reviewed the triage vital signs and the nursing notes.  Pertinent labs & imaging results that were available during my care of the patient were reviewed by me and considered in my medical decision making (see chart for details).  Clinical Course    Patient here for evaluation of tachycardia and dry mouth after smoking marijuana. EKG demonstrates sinus tachycardia. There are no ischemic changes on the EKG and he has no chest pain. His tachycardia is improving during his evaluation in the emergency department. Patient avoiding  marijuana and illicit drugs as this is contributing to symptoms. Discussed home care, outpatient follow-up, return precautions.  Final Clinical Impressions(s) / ED Diagnoses   Final diagnoses:  Sinus tachycardia (HCC)  Drug reaction, initial encounter    New Prescriptions New Prescriptions   No medications on file     Tilden Fossa, MD 11/16/15 1637

## 2015-12-28 ENCOUNTER — Emergency Department (HOSPITAL_COMMUNITY)
Admission: EM | Admit: 2015-12-28 | Discharge: 2015-12-28 | Disposition: A | Discharge status: Home / Self Care | Payer: Self-pay | Attending: Emergency Medicine | Admitting: Emergency Medicine

## 2015-12-28 ENCOUNTER — Encounter (HOSPITAL_COMMUNITY): Payer: Self-pay | Admitting: *Deleted

## 2015-12-28 DIAGNOSIS — R197 Diarrhea, unspecified: Secondary | ICD-10-CM | POA: Insufficient documentation

## 2015-12-28 HISTORY — DX: Migraine, unspecified, not intractable, without status migrainosus: G43.909

## 2015-12-28 HISTORY — DX: Anxiety disorder, unspecified: F41.9

## 2015-12-28 LAB — CBC
HEMATOCRIT: 47.3 % (ref 39.0–52.0)
HEMOGLOBIN: 16 g/dL (ref 13.0–17.0)
MCH: 30.4 pg (ref 26.0–34.0)
MCHC: 33.8 g/dL (ref 30.0–36.0)
MCV: 89.9 fL (ref 78.0–100.0)
PLATELETS: 265 10*3/uL (ref 150–400)
RBC: 5.26 MIL/uL (ref 4.22–5.81)
RDW: 12.6 % (ref 11.5–15.5)
WBC: 9.8 10*3/uL (ref 4.0–10.5)

## 2015-12-28 LAB — URINALYSIS, ROUTINE W REFLEX MICROSCOPIC
Bilirubin Urine: NEGATIVE
GLUCOSE, UA: NEGATIVE mg/dL
HGB URINE DIPSTICK: NEGATIVE
Ketones, ur: NEGATIVE mg/dL
LEUKOCYTES UA: NEGATIVE
Nitrite: NEGATIVE
PH: 7 (ref 5.0–8.0)
PROTEIN: NEGATIVE mg/dL
SPECIFIC GRAVITY, URINE: 1.012 (ref 1.005–1.030)

## 2015-12-28 LAB — COMPREHENSIVE METABOLIC PANEL
ALT: 21 U/L (ref 17–63)
ANION GAP: 6 (ref 5–15)
AST: 24 U/L (ref 15–41)
Albumin: 4.2 g/dL (ref 3.5–5.0)
Alkaline Phosphatase: 64 U/L (ref 38–126)
BUN: 13 mg/dL (ref 6–20)
CO2: 26 mmol/L (ref 22–32)
Calcium: 8.9 mg/dL (ref 8.9–10.3)
Chloride: 104 mmol/L (ref 101–111)
Creatinine, Ser: 0.83 mg/dL (ref 0.61–1.24)
Glucose, Bld: 113 mg/dL — ABNORMAL HIGH (ref 65–99)
POTASSIUM: 4.2 mmol/L (ref 3.5–5.1)
Sodium: 136 mmol/L (ref 135–145)
Total Bilirubin: 0.5 mg/dL (ref 0.3–1.2)
Total Protein: 7.2 g/dL (ref 6.5–8.1)

## 2015-12-28 LAB — LIPASE, BLOOD: LIPASE: 26 U/L (ref 11–51)

## 2015-12-28 MED ORDER — OMEPRAZOLE 20 MG PO CPDR: 20.0000 mg | DELAYED_RELEASE_CAPSULE | Freq: Every day | ORAL | 0 refills | Status: DC

## 2015-12-28 MED ORDER — ONDANSETRON 4 MG PO TBDP: 4.0000 mg | ORAL_TABLET | Freq: Three times a day (TID) | ORAL | 0 refills | Status: DC | PRN

## 2015-12-28 NOTE — ED Triage Notes (Addendum)
Pt states diarrhea and nausea x three days but states NO abdominal pain.  Initially the stool was green but today it was black.  States diarrhea immediately after eating. Pt smoked pot for the first time several weeks ago and since then he has been highly anxious.

## 2015-12-28 NOTE — ED Notes (Signed)
Pt speaks little [place]. Updated Pt on wait. Pt's vitals all in normal range. Pt does not need anything at this time.

## 2015-12-28 NOTE — ED Provider Notes (Signed)
[facility] Provider Note   CSN: [id number] Arrival date & time: 12/28/15  1419     History   Chief Complaint Chief Complaint  Patient presents with  . Diarrhea    HPI Howard Bender is a 25 y.o. male.  HPI Patient is a 25 year old male with no past medical history comes in today complaining of nausea, abdominal discomfort, and diarrhea.  Patient states his symptoms began approximately 3 days ago.  Patient denies fevers chills.  Patient denies emesis.  Patient states that he has had several episodes of loose green in black stools.  Patient also states that he becomes nauseated whenever he smells food.  Patient states he has continued to eat despite this and generally had a bowel movement very quickly after eating.  Patient denies chest pain or shortness of breath. Past Medical History:  Diagnosis Date  . Anxiety   . Migraines    sees neurologist    Patient Active Problem List   Diagnosis Date Noted  . Dizziness 07/27/2015  . Chronic daily headache 07/08/2014  . Anxiety state 07/08/2014  . Migraine without aura and without status migrainosus, not intractable 01/27/2014  . Headache(784.0) 12/01/2013    History reviewed. No pertinent surgical history.     Home Medications    Prior to Admission medications   Medication Sig Start Date End Date Taking? Authorizing Provider  nortriptyline (PAMELOR) 50 MG capsule Take one capsule at night Patient taking differently: Take 50 mg by mouth at bedtime. Take one capsule at night 07/27/15  Yes Van Clines, MD  omeprazole (PRILOSEC) 20 MG capsule Take 1 capsule (20 mg total) by mouth daily. 12/28/15 01/27/16  Caren Griffins, MD  ondansetron (ZOFRAN ODT) 4 MG disintegrating tablet Take 1 tablet (4 mg total) by mouth every 8 (eight) hours as needed for nausea or vomiting. 12/28/15   Caren Griffins, MD    Family History Family History  Problem Relation Age of Onset  . Diabetes Mother   . High blood pressure Mother   .  Prostate cancer Maternal Grandfather   . Cancer Maternal Grandmother     Social History Social History  Substance Use Topics  . Smoking status: Never Smoker  . Smokeless tobacco: Never Used  . Alcohol use No     Allergies   Phenergan [promethazine hcl] and Caffeine   Review of Systems Review of Systems  Constitutional: Negative for chills, fatigue and fever.  Respiratory: Negative for chest tightness and shortness of breath.   Cardiovascular: Negative for chest pain.  Gastrointestinal: Positive for diarrhea and nausea. Negative for abdominal pain, anal bleeding, blood in stool and vomiting.  Genitourinary: Negative for dysuria, scrotal swelling and testicular pain.  All other systems reviewed and are negative.    Physical Exam Updated Vital Signs BP 139/78   Pulse 86   Temp 98.4 F (36.9 C) (Oral)   Resp 16   SpO2 100%   Physical Exam  Constitutional: He appears well-developed and well-nourished.  HENT:  Head: Normocephalic and atraumatic.  Eyes: Conjunctivae are normal.  Neck: Neck supple.  Cardiovascular: Normal rate and regular rhythm.   No murmur heard. Pulmonary/Chest: Effort normal and breath sounds normal. No respiratory distress.  Abdominal: Soft. There is no tenderness.  Musculoskeletal: He exhibits no edema.  Neurological: He is alert.  Skin: Skin is warm and dry.  Psychiatric: He has a normal mood and affect.  Nursing note and vitals reviewed.    ED Treatments / Results  Labs (all labs ordered  are listed, but only abnormal results are displayed) Labs Reviewed  COMPREHENSIVE METABOLIC PANEL - Abnormal; Notable for the following:       Result Value   Glucose, Bld 113 (*)    All other components within normal limits  LIPASE, BLOOD  CBC  URINALYSIS, ROUTINE W REFLEX MICROSCOPIC (NOT AT [facility])    EKG  EKG Interpretation None       Radiology No results found.  Procedures Procedures (including critical care time)  Medications Ordered  in ED Medications - No data to display   Initial Impression / Assessment and Plan / ED Course  I have reviewed the triage vital signs and the nursing notes.  Pertinent labs & imaging results that were available during my care of the patient were reviewed by me and considered in my medical decision making (see chart for details).  Clinical Course   Patient is a 25 year old male with no past medical history comes in today complaining of nausea, abdominal discomfort, and diarrhea.  Patient states his symptoms began approximately 3 days ago.  Patient denies fevers chills.  Patient denies emesis.  Patient states that he has had several episodes of loose green in black stools.  Patient also states that he becomes nauseated whenever he smells food.  Patient states he has continued to eat despite this and generally had a bowel movement very quickly after eating.  Patient denies chest pain or shortness of breath.  Physical exam patient clear to auscultation bilaterally normal S1-S2 no rubs murmurs gallops abdomen soft nontender.  Remainder physical exam within normal limits.  Basic labs collected which showed no acute abnormalities or concerning findings.  Believe patient be suffering from viral GI illness.  I given appropriate follow-up and return precautions and written a prescription for Zofran for patient's nausea as well for PPI.  Patient voiced understanding patient agreeable to discharge this time.    Final Clinical Impressions(s) / ED Diagnoses   Final diagnoses:  Diarrhea, unspecified type    New Prescriptions Discharge Medication List as of 12/28/2015 10:26 PM    START taking these medications   Details  omeprazole (PRILOSEC) 20 MG capsule Take 1 capsule (20 mg total) by mouth daily., Starting Mon 12/28/2015, Until Wed 01/27/2016, Print    ondansetron (ZOFRAN ODT) 4 MG disintegrating tablet Take 1 tablet (4 mg total) by mouth every 8 (eight) hours as needed for nausea or vomiting.,  Starting Mon 12/28/2015, Print         Caren GriffinsAdam Leroi Haque, MD 12/29/15 [id number]    Dione Boozeavid Glick, MD 12/29/15 [phone]

## 2015-12-28 NOTE — ED Notes (Signed)
Pt asked and was updated about current wait time.

## 2016-01-08 ENCOUNTER — Encounter (HOSPITAL_COMMUNITY): Payer: Self-pay | Admitting: *Deleted

## 2016-01-08 ENCOUNTER — Emergency Department (HOSPITAL_COMMUNITY): Payer: No Typology Code available for payment source

## 2016-01-08 ENCOUNTER — Emergency Department (HOSPITAL_COMMUNITY)
Admission: EM | Admit: 2016-01-08 | Discharge: 2016-01-08 | Disposition: A | Discharge status: Home / Self Care | Payer: No Typology Code available for payment source | Attending: Emergency Medicine | Admitting: Emergency Medicine

## 2016-01-08 DIAGNOSIS — F419 Anxiety disorder, unspecified: Secondary | ICD-10-CM | POA: Insufficient documentation

## 2016-01-08 DIAGNOSIS — R0602 Shortness of breath: Secondary | ICD-10-CM | POA: Insufficient documentation

## 2016-01-08 DIAGNOSIS — R0789 Other chest pain: Secondary | ICD-10-CM | POA: Insufficient documentation

## 2016-01-08 DIAGNOSIS — R630 Anorexia: Secondary | ICD-10-CM | POA: Insufficient documentation

## 2016-01-08 LAB — CBC
Hemoglobin: 15.6 g/dL (ref 13.0–17.0)
MCH: 30.4 pg (ref 26.0–34.0)
MCHC: 35.1 g/dL (ref 30.0–36.0)
MCV: 86.5 fL (ref 78.0–100.0)

## 2016-01-08 LAB — BASIC METABOLIC PANEL
Anion gap: 8 (ref 5–15)
CO2: 23 mmol/L (ref 22–32)
Calcium: 9.3 mg/dL (ref 8.9–10.3)
Glucose, Bld: 187 mg/dL — ABNORMAL HIGH (ref 65–99)

## 2016-01-08 LAB — I-STAT TROPONIN, ED: Troponin i, poc: 0 ng/mL (ref 0.00–0.08)

## 2016-01-08 MED ORDER — ALPRAZOLAM 0.5 MG PO TABS: 0.5000 mg | ORAL_TABLET | Freq: Three times a day (TID) | ORAL | 0 refills | Status: DC | PRN

## 2016-01-08 NOTE — ED Triage Notes (Addendum)
Patient states he feels as though his heart is racing and he cannot breathe.  Patient was seen for tachycardia in July after smoking marijuana.  He states he has not smoked marijuana or used street drugs since July, but each day since that episode, he has felt as though his heart is racing intermittently.  Patient states the sensation begins in the morning as he works and gets worse as the day goes along.  Patient states the intermittent palpitations are also accompanied by intermittent chest pain and the sensation that he cannot breathe.  Patient denies use of caffeine.  Patient uses an Rx for headaches, but he does not know the name of that medication.  Patient denies N/V and fever.  Patient has diarrhea daily and that began in July after he smoked marijuana as well.

## 2016-01-08 NOTE — ED Provider Notes (Signed)
[facility]-EMERGENCY DEPT Provider Note   CSN: [id number] Arrival date & time: 01/08/16  1629 By signing my name below, I, Levon Hedger, attest that this documentation has been prepared under the direction and in the presence of non-physician practitioner, Melvenia Beam A Ameerah Huffstetler PA-C. Electronically Signed: Levon Hedger, Scribe. 01/08/2016. 9:26 PM.   History   Chief Complaint Chief Complaint  Patient presents with  . Palpitations   HPI Howard Bender is a 25 y.o. male who presents to the Emergency Department complaining of intermittent palpitations onset two months ago. He also notes associated intermittent dry mouth, nervousness, SOB, decreased appetite, diarrhea, and nausea.. He states that his heart begins to race in the morning while at work and his palpitations worsen throughout the day. 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 Pt's symptoms resolve themselves once he is home.  Pt states that he will also have these symptoms when he "sees someone fighting or an accident".  He is currently followed by a neurologist for headaches. Pt denies any caffeine use. He denies any OTC cough or cold medication. He denies any abdominal pain or vomiting.Pt denies any palpitations during exam. NKDA.  HPI  History reviewed. No pertinent past medical history.  There are no active problems to display for this patient.  History reviewed. No pertinent surgical history.   Home Medications    Prior to Admission medications   Medication Sig Start Date End Date Taking? Authorizing Provider  nortriptyline (PAMELOR) 50 MG capsule Take 50 mg by mouth at bedtime.    Historical Provider, MD    Family History No family history on file.  Social History Social History  Substance Use Topics  . Smoking status: Never Smoker  . Smokeless tobacco: Never Used  . Alcohol use No   Allergies   Caffeine  Review of Systems Review of Systems  Constitutional: Positive for appetite change.  Negative for fever.  HENT:       Positive for dry mouth  Respiratory: Positive for chest tightness and shortness of breath.   Cardiovascular: Positive for palpitations.  Gastrointestinal: Positive for diarrhea and nausea. Negative for abdominal pain and vomiting.  Musculoskeletal: Negative for myalgias.  Skin: Negative for rash.  Neurological: Positive for headaches. Negative for weakness and light-headedness.  Psychiatric/Behavioral: The patient is nervous/anxious.    Physical Exam Updated Vital Signs BP 131/71 (BP Location: Left Arm)   Pulse 88   Temp 97.8 F (36.6 C) (Oral)   Resp 20   Wt 129 lb (58.5 kg)   SpO2 100%   Physical Exam  Constitutional: He is oriented to person, place, and time. He appears well-developed and well-nourished. No distress.  HENT:  Head: Normocephalic and atraumatic.  Eyes: Conjunctivae are normal.  Neck: Normal range of motion.  Cardiovascular: Normal rate and regular rhythm.   No murmur heard. Pulmonary/Chest: Effort normal. He has no wheezes. He has no rales.  Abdominal: He exhibits no distension.  Musculoskeletal: Normal range of motion.  Neurological: He is alert and oriented to person, place, and time.  Skin: Skin is warm and dry.  Psychiatric: He has a normal mood and affect.  Nursing note and vitals reviewed.  ED Treatments / Results  DIAGNOSTIC STUDIES:  Oxygen Saturation is 100% on RA, normal by my interpretation.    COORDINATION OF CARE:  9:10 PM Discussed treatment plan with pt at bedside and pt agreed to plan.   Labs (all labs ordered are listed, but only abnormal results  are displayed) Labs Reviewed  BASIC METABOLIC PANEL - Abnormal; Notable for the following:       Result Value   Glucose, Bld 187 (*)    All other components within normal limits  CBC - Abnormal; Notable for the following:    WBC 10.7 (*)    All other components within normal limits  I-STAT TROPOININ, ED    EKG  EKG Interpretation None       Radiology Dg Chest 2 View  Result Date: 01/08/2016 CLINICAL DATA:  Chest pain, tachycardia, and shortness of breath for several months. EXAM: CHEST  2 VIEW COMPARISON:  None. FINDINGS: The heart size and mediastinal contours are within normal limits. Both lungs are clear. No evidence of pneumothorax or pleural effusion. The visualized skeletal structures are unremarkable. IMPRESSION: Negative.  No active cardiopulmonary disease. Electronically Signed   By: Myles RosenthalJohn  Stahl M.D.   On: 01/08/2016 17:12    Procedures Procedures (including critical care time)  Medications Ordered in ED Medications - No data to display   Initial Impression / Assessment and Plan / ED Course  I have reviewed the triage vital signs and the nursing notes.  Pertinent labs & imaging results that were available during my care of the patient were reviewed by me and considered in my medical decision making (see chart for details).  Clinical Course    Patient with c/o palpitations and nervousness with high stress situations or fear, c/w anxiety. Labs, EKG, CXR reassuring. Will Rx Xanax and strongly encouraged PCP follow up for further management.   Final Clinical Impressions(s) / ED Diagnoses   Final diagnoses:  None  1. Anxiety/panic  I personally performed the services described in this documentation, which was scribed in my presence. The recorded information has been reviewed and is accurate.    New Prescriptions New Prescriptions   No medications on file     Elpidio AnisShari Yancarlos Berthold, Cordelia Poche-C 01/08/16 2219    Benjiman CoreNathan Pickering, MD 01/10/16 [phone]

## 2016-01-18 ENCOUNTER — Emergency Department (HOSPITAL_COMMUNITY): Admission: EM | Admit: 2016-01-18 | Discharge: 2016-01-18 | Discharge status: Against Medical Advice | Payer: Self-pay

## 2016-01-18 NOTE — ED Notes (Signed)
Went into pts room to do v/s, Pt says he feels better and his brother is picking him up.

## 2016-01-18 NOTE — ED Triage Notes (Signed)
Pt comes from home via EMS with complaints of anxiety and heart palpitations that are occurring everyday.  Was prescribed nortriptyline which initially helped with his anxiety but has reduced in relief. A&O x4 and ambulatory in triage.  154/80 BP, 88 HR, 16 RR, 100% RA.

## 2016-01-18 NOTE — ED Notes (Signed)
Patient currently on the phone in triage room.

## 2016-01-25 ENCOUNTER — Other Ambulatory Visit: Payer: Self-pay | Admitting: Neurology

## 2016-01-25 NOTE — Telephone Encounter (Signed)
Refused RX refill: Patient asking for refill too soon.

## 2016-02-01 ENCOUNTER — Encounter: Payer: Self-pay | Admitting: Neurology

## 2016-02-01 ENCOUNTER — Ambulatory Visit (INDEPENDENT_AMBULATORY_CARE_PROVIDER_SITE_OTHER): Payer: Self-pay | Admitting: Neurology

## 2016-02-01 VITALS — BP 118/76 | HR 93 | Temp 98.1°F | Ht 63.5 in | Wt 126.2 lb

## 2016-02-01 DIAGNOSIS — R42 Dizziness and giddiness: Secondary | ICD-10-CM

## 2016-02-01 DIAGNOSIS — G43009 Migraine without aura, not intractable, without status migrainosus: Secondary | ICD-10-CM

## 2016-02-01 MED ORDER — NORTRIPTYLINE HCL 50 MG PO CAPS: ORAL_CAPSULE | ORAL | 11 refills | Status: DC

## 2016-02-01 NOTE — Progress Notes (Signed)
NEUROLOGY FOLLOW UP OFFICE NOTE  Howard Bender [id number]  HISTORY OF PRESENT ILLNESS: I had the pleasure of seeing Howard Bender in follow-up in the neurology clinic on 02/01/2016.  The patient was last seen 6 months ago for headaches. He is again accompanied by his friend today who helps supplement the history. A Spanish medical interpreter helps with translation. On his last visit, he reported dizziness and headaches on lower dose of nortriptyline, and he was restarted on nortriptyline 50mg  qhs. Since then, headaches and dizziness have resolved. He ran out of medication for the past week, no recurrence of dizziness, but he has noticed more light sensitivity. He denies any vision changes, focal numbness/tingling/weakness, no falls. He is reporting anxiety attacks that started when he took 2 puffs of marijuana in the past. He was so sick and went to the ER, but since then would get panic attacks with palpitations and feel very scared.   HPI: This is a pleasant 25 yo RH man with no significant past medical history in his usually state of health until the past year when he started having left temporal, bilateral retro-orbital and periorbital (above the eyelids) sharp pain with associated photosensitivity. He reports that pain usually occurs when he walks around, he feels better sitting down and after eating. He feels that symptoms started when he began using his cellphone, with his head bent forward. Extending his head back seemed to help. He reported pain occurs daily, with rarely a day without pain. 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 When the pain first started, he was having associated nausea, which was relieved with Zofran. He has stopped taking this. He also started having dizziness described as a brief spinning sensation with head movement, resolving when head is still. He also had black floaters in his vision.  He had seen an optometrist twice and was told vision was okay. He had seen ENT, evaluation unremarkable, and symptoms felt to be due to migraine. He took 2 tablets of Excedrin migraine one time, however had side effects of whole body numbness, leading to an visit to [facility] ER last 08/2013 where head CT done which I personally reviewed was unremarkable. He initially reported improvement in symptoms with nortriptyline 30mg  qhs. 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 There is no family history of headaches.  PAST MEDICAL HISTORY: Past Medical History:  Diagnosis Date  . Anxiety   . Migraines    sees neurologist    MEDICATIONS: Current Outpatient Prescriptions on File Prior to Visit  Medication Sig Dispense Refill  . nortriptyline (PAMELOR) 50 MG capsule Take 1 capsule at bedtime 120 capsule 5   No current facility-administered medications on file prior to visit.    ALLERGIES: Allergies  Allergen Reactions  . Phenergan [Promethazine Hcl] Anxiety and Palpitations  . Caffeine Nausea And Vomiting    FAMILY HISTORY: Family History  Problem Relation Age of Onset  . Diabetes Mother   . High blood pressure Mother   . Prostate cancer Maternal Grandfather   . Cancer Maternal Grandmother     SOCIAL HISTORY: Social History   Social History  . Marital status: Married    Spouse name: N/A  . Number of children: 1  . Years of education: N/A   Occupational History  . cook    Social History Main Topics  . Smoking status:  Never Smoker  . Smokeless tobacco: Never Used  . Alcohol use No  . Drug use:     Types: Marijuana  . Sexual activity: Not on file   Other Topics Concern  . Not on file   Social History Narrative   ** Merged History Encounter **        REVIEW OF SYSTEMS: Constitutional: No fevers, chills, or sweats, no generalized fatigue, change in appetite Eyes: No visual changes, double vision, eye pain Ear, nose and throat: No hearing  loss, ear pain, nasal congestion, sore throat Cardiovascular: No chest pain, palpitations Respiratory:  No shortness of breath at rest or with exertion, wheezes GastrointestinaI: No nausea, vomiting, diarrhea, abdominal pain, fecal incontinence Genitourinary:  No dysuria, urinary retention or frequency Musculoskeletal:  No neck pain, back pain Integumentary: No rash, pruritus, skin lesions Neurological: as above Psychiatric: No depression, insomnia, anxiety Endocrine: No palpitations, fatigue, diaphoresis, mood swings, change in appetite, change in weight, increased thirst Hematologic/Lymphatic:  No anemia, purpura, petechiae. Allergic/Immunologic: no itchy/runny eyes, nasal congestion, recent allergic reactions, rashes  PHYSICAL EXAM: Vitals:   02/01/16 1120  BP: 118/76  Pulse: 93  Temp: 98.1 F (36.7 C)   General: No acute distress Head:  Normocephalic/atraumatic Neck: supple, no paraspinal tenderness, full range of motion Heart:  Regular rate and rhythm Lungs:  Clear to auscultation bilaterally Back: No paraspinal tenderness Skin/Extremities: No rash, no edema Neurological Exam: alert and oriented to person, place, and time. No aphasia or dysarthria. Fund of knowledge is appropriate.  Recent and remote memory are intact.  Attention and concentration are normal.    Able to name objects and repeat phrases. Cranial nerves: Pupils equal, round, reactive to light. Extraocular movements intact with no nystagmus. Visual fields full. Facial sensation intact. No facial asymmetry. Tongue, uvula, palate midline.  Motor: Bulk and tone normal, muscle strength 5/5 throughout with no pronator drift.  Sensation to light touch intact.  No extinction to double simultaneous stimulation.  Deep tendon reflexes 2+ throughout, toes downgoing.  Finger to nose testing intact.  Gait narrow-based and steady, able to tandem walk adequately.  Romberg negative.  IMPRESSION: This is a pleasant 25 yo RH man who  presented with chronic daily headaches in 2015. Neurological exam and head CT normal. Headaches have resolved with nortriptyline 50mg  qhs. No further dizziness. Exam today normal. Refills sent. His main question was regarding anxiety and concern for habit-forming medications, discussed that there are other medications for anxiety without dependence potential, he will discuss this with his PCP. He will follow-up in 1 year and knows to call our office for any problems in the interim.  Thank you for allowing me to participate in his care.  Please do not hesitate to call for any questions or concerns.  The duration of this appointment visit was 15 minutes of face-to-face time with the patient.  Greater than 50% of this time was spent in counseling, explanation of diagnosis, planning of further management, and coordination of care.   Patrcia DollyKaren Tasha Diaz, M.D.   CC: Dr. Lonzo CloudShamleffer

## 2016-02-01 NOTE — Patient Instructions (Signed)
1. Continue nortriptyline 50mg : take 1 capsule at night 2. Speak to your PCP about the anxiety 3. Follow-up in 1 year, call for any changes

## 2016-04-20 ENCOUNTER — Ambulatory Visit (INDEPENDENT_AMBULATORY_CARE_PROVIDER_SITE_OTHER): Payer: Self-pay | Admitting: Neurology

## 2016-04-20 ENCOUNTER — Encounter: Payer: Self-pay | Admitting: Neurology

## 2016-04-20 VITALS — BP 128/78 | HR 106 | Ht 63.5 in | Wt 133.5 lb

## 2016-04-20 DIAGNOSIS — R42 Dizziness and giddiness: Secondary | ICD-10-CM

## 2016-04-20 DIAGNOSIS — R200 Anesthesia of skin: Secondary | ICD-10-CM

## 2016-04-20 DIAGNOSIS — R202 Paresthesia of skin: Secondary | ICD-10-CM

## 2016-04-20 MED ORDER — PAROXETINE HCL 20 MG PO TABS: ORAL_TABLET | ORAL | 6 refills | Status: DC

## 2016-04-20 NOTE — Patient Instructions (Signed)
1. Start Paroxetine 20mg : Take 1/2 tablet daily for 2 weeks, then increase to 1 tablet daily 2. Continue nortriptyline 50mg  at night 3. Apply for [facility]. Once eligible, schedule MRI brain with and without contrast 4. Follow-up in 3 months, call for any changes

## 2016-04-20 NOTE — Progress Notes (Signed)
NEUROLOGY FOLLOW UP OFFICE NOTE  Howard Bender [id number]  HISTORY OF PRESENT ILLNESS: I had the pleasure of seeing Howard Bender in follow-up in the neurology clinic on 04/20/2016.  The patient was last seen 5 months ago for headaches. He is again accompanied by his friend today who helps supplement the history. A Spanish medical interpreter helps with translation. On his last visit, he reported dizziness and headaches had resolved on nortriptyline 50mg  qhs. He was doing well for 5 months with no symptoms except for anxiety attacks occurring on a daily basis, however over the past week, he has been feeling bad again. The anxiety attacks stopped last Thursday when he started feeling bad, he describes numbness in the bilateral temporal and occipital regions that is constant. He feels strange and dizzy, described as if he is floating, stepping and taking bad steps. No vertigo. He has minimal pain over the eyelids, otherwise he denies any headaches. He was really ill Dec 1/Jan1, where he was seeing double, and looked up from bed and felt like he was upside down. He feels the nortriptyline is not helping anymore. With the head sensations back, he started to notice constipation as well. He is complaining of difficulties with intercourse again. He is sensitive to fluorescent lights, no nausea/vomiting. He denies any focal numbness/tingling/weakness in the extremities. He has occasional tingling in his head. No neck pain. He denies any recent infections, no fever/cough/cold. Sleep is good.  HPI: This is a pleasant 25 yo RH man with no significant past medical history in his usually state of health until the past year when he started having left temporal, bilateral retro-orbital and periorbital (above the eyelids) sharp pain with associated photosensitivity. He reports that pain usually occurs when he walks around, he feels better sitting down and after eating. He feels that symptoms started when he began  using his cellphone, with his head bent forward. Extending his head back seemed to help. He reported pain occurs daily, with rarely a day without pain. 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 When the pain first started, he was having associated nausea, which was relieved with Zofran. He has stopped taking this. He also started having dizziness described as a brief spinning sensation with head movement, resolving when head is still. He also had black floaters in his vision. He had seen an optometrist twice and was told vision was okay. He had seen ENT, evaluation unremarkable, and symptoms felt to be due to migraine. He took 2 tablets of Excedrin migraine one time, however had side effects of whole body numbness, leading to an visit to [facility] ER last 08/2013 where head CT done which I personally reviewed was unremarkable. He initially reported improvement in symptoms with nortriptyline 30mg  qhs. 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 There is no family history of headaches.  PAST MEDICAL HISTORY: Past Medical History:  Diagnosis Date  . Anxiety   . Migraines    sees neurologist    MEDICATIONS:  Outpatient Encounter Prescriptions as of 04/20/2016  Medication Sig  . nortriptyline (PAMELOR) 50 MG capsule Take one capsule at night  . omeprazole (PRILOSEC) 20 MG capsule Take 1 capsule (20 mg total) by mouth daily.  . ondansetron (ZOFRAN ODT) 4 MG disintegrating tablet Take 1 tablet (4 mg total) by mouth every 8 (eight) hours as needed for nausea  or vomiting. (Patient not taking: Reported on 04/20/2016)  .    Marland Kitchen     No facility-administered encounter medications on file as of 04/20/2016.     ALLERGIES: Allergies  Allergen Reactions  . Phenergan [Promethazine Hcl] Anxiety and Palpitations  . Caffeine Nausea And Vomiting    FAMILY HISTORY: Family History  Problem Relation Age of Onset  .  Diabetes Mother   . High blood pressure Mother   . Prostate cancer Maternal Grandfather   . Cancer Maternal Grandmother     SOCIAL HISTORY: Social History   Social History  . Marital status: Married    Spouse name: N/A  . Number of children: 1  . Years of education: N/A   Occupational History  . cook    Social History Main Topics  . Smoking status: Never Smoker  . Smokeless tobacco: Never Used  . Alcohol use No  . Drug use:     Types: Marijuana  . Sexual activity: Not on file   Other Topics Concern  . Not on file   Social History Narrative   ** Merged History Encounter **        REVIEW OF SYSTEMS: Constitutional: No fevers, chills, or sweats, no generalized fatigue, change in appetite Eyes: No visual changes, double vision, eye pain Ear, nose and throat: No hearing loss, ear pain, nasal congestion, sore throat Cardiovascular: No chest pain, palpitations Respiratory:  No shortness of breath at rest or with exertion, wheezes GastrointestinaI: No nausea, vomiting, diarrhea, abdominal pain, fecal incontinence Genitourinary:  No dysuria, urinary retention or frequency Musculoskeletal:  No neck pain, back pain Integumentary: No rash, pruritus, skin lesions Neurological: as above Psychiatric: No depression, insomnia, anxiety Endocrine: No palpitations, fatigue, diaphoresis, mood swings, change in appetite, change in weight, increased thirst Hematologic/Lymphatic:  No anemia, purpura, petechiae. Allergic/Immunologic: no itchy/runny eyes, nasal congestion, recent allergic reactions, rashes  PHYSICAL EXAM: Vitals:   04/20/16 1027  BP: 128/78  Pulse: (!) 106   General: No acute distress Head:  Normocephalic/atraumatic Neck: supple, no paraspinal tenderness, full range of motion Heart:  Regular rate and rhythm Lungs:  Clear to auscultation bilaterally Back: No paraspinal tenderness Skin/Extremities: No rash, no edema Neurological Exam: alert and oriented to person,  place, and time. No aphasia or dysarthria. Fund of knowledge is appropriate.  Recent and remote memory are intact.  Attention and concentration are normal.    Able to name objects and repeat phrases. Cranial nerves: Pupils equal, round, reactive to light. Fundoscopic exam shows sharp discs bilaterally. Extraocular movements intact with no nystagmus. Visual fields full. Facial sensation intact. No facial asymmetry. Tongue, uvula, palate midline.  Motor: Bulk and tone normal, muscle strength 5/5 throughout with no pronator drift.  Sensation to light touch intact.  No extinction to double simultaneous stimulation.  Deep tendon reflexes 2+ throughout, toes downgoing.  Finger to nose testing intact.  Gait narrow-based and steady, able to tandem walk adequately.  Romberg negative.  IMPRESSION: This is a pleasant 26 yo RH man who presented with chronic daily headaches in 2015. He did well for several months on nortriptyline 50mg  qhs, but returns today reporting feeling bad for the past week with numbness and tingling in his head, around the temporal and occipital regions, no head pain, as well as constant dizziness described as floating and dysequilibrium. His neurological exam is normal. The etiology of his symptoms is unclear, MRI brain with and without contrast will be ordered to assess for underlying structural abnormality. He reports daily  anxiety attacks for the past 5 months that went away when these head sensations/symptoms started, I also wonder about anxiety with chronic subjective dizziness. SSRIs have been found to be helpful. He will start low dose Paroxetin 10mg  daily for 2 weeks, then increase to 20mg  daily. Side effects were discussed. Continue nortriptyline 50mg  qhs for now, we will plan to taper in the future if Paxil is helpful. He will follow-up in 3 months and knows to call our office for any problems in the interim.  Thank you for allowing me to participate in his care.  Please do not hesitate to  call for any questions or concerns.  The duration of this appointment visit was 15 minutes of face-to-face time with the patient.  Greater than 50% of this time was spent in counseling, explanation of diagnosis, planning of further management, and coordination of care.   Patrcia DollyKaren Aquino, M.D.   CC: Dr. Lonzo CloudShamleffer

## 2016-04-21 ENCOUNTER — Telehealth: Payer: Self-pay | Admitting: Neurology

## 2016-04-21 NOTE — Telephone Encounter (Signed)
Called Howard Bender back and she was asking if patient can continue other medications with the one Dr. Karel JarvisAquino gave him yesterday.  Informed her that it was ok to continue all medications.

## 2016-04-21 NOTE — Telephone Encounter (Signed)
Howard Bender 1990-11-24. # [phone] Delia called for him. He needs to know can he still take both medications? He was here yesterday and saw Dr. Karel JarvisAquino. He  was given a new medication. He needs to know can they both be taken together? Thank you

## 2016-05-11 ENCOUNTER — Telehealth: Payer: Self-pay

## 2016-05-11 NOTE — Telephone Encounter (Signed)
Stacey from [facility] called and asked if we can send an order for patient to have the MRI Brain W W/O. She works with patients that have the [employer] to get imaging approved. Please advise.

## 2016-05-13 ENCOUNTER — Other Ambulatory Visit: Payer: Self-pay

## 2016-05-13 DIAGNOSIS — G43009 Migraine without aura, not intractable, without status migrainosus: Secondary | ICD-10-CM

## 2016-05-13 NOTE — Telephone Encounter (Signed)
That is fine, pls send, thanks

## 2016-05-13 NOTE — Telephone Encounter (Signed)
Notified Stacey at [facility] we will fax order.

## 2016-06-14 ENCOUNTER — Telehealth: Payer: Self-pay | Admitting: Neurology

## 2016-06-14 NOTE — Telephone Encounter (Signed)
PT called and said he is not feeling well and wants to come in sooner/Dawn CB# [phone]

## 2016-06-15 ENCOUNTER — Ambulatory Visit: Payer: Self-pay

## 2016-06-15 NOTE — Telephone Encounter (Signed)
Clld pt - spoke to Delia/transporter (verified designated rel form)  [phone] - Pt is very anxious; HA - 9/10; dizziness; experiencing some nausea.   Reviewed w/ Dr. Karel JarvisAquino  - Pt can have migraine cocktail: Toradol 60 mg; Reglan 10 mg; Benadryl 25 mg. Pt must have driver.  Advsd Delia of provider's recommendation - Delia will bring pt tomorrow, Thursday, 06/16/16 at 12 noon for nurse visit to receive injection.   Interpretor will be needed.

## 2016-06-16 ENCOUNTER — Ambulatory Visit (INDEPENDENT_AMBULATORY_CARE_PROVIDER_SITE_OTHER): Payer: Self-pay

## 2016-06-16 DIAGNOSIS — G43009 Migraine without aura, not intractable, without status migrainosus: Secondary | ICD-10-CM

## 2016-06-16 DIAGNOSIS — R519 Headache, unspecified: Secondary | ICD-10-CM

## 2016-06-16 DIAGNOSIS — R51 Headache: Secondary | ICD-10-CM

## 2016-06-16 NOTE — Progress Notes (Signed)
Pt was here today for migraine cocktail injection.   Per Delia, translator - pt stated he did not have a headache or migraine - he only feels/felt pressure in his head like worms or something was crawling around in his head.   Pt also stated he stopped taking Paxil 20 mg the second week in January 2018 due to not being able to sleep at night and feeling numbness in his right arm that radiated down to his left pinky and ring fingers. I asked if he ever called the office to report the side effects - he stated no.  I asked pt how did he feel when taking just 10 mg  - he stated he has  Always taken 20 mg never 10 mg. I advised him that he was suppose to begin taken 1/2 tablet for two weeks the begin taking 20 mg. He stated again he never did that - he didn't know that's what he was suppose to do. I advised him the directions were on his AVS and prescription bottle. He stated he didn't know.  I reviewed pt's concerns with Dr. Karel JarvisAquino.  Per Dr. Karel JarvisAquino - pt is to begin taking Paxil 20 mg again with the following directions: 1/2 tablet(10 mg) every other day for two weeks. After the two weeks, begin taking 1/2 tablet daily until he sees her on 07/18/16 at 9 am.  Delia translated  - we both made sure he understood all directions. He stated he did.  Pt was also advsd his MRI was normal - no tumors, strokes, etc. Pt stated he understood.  No injection was given.

## 2016-06-22 ENCOUNTER — Telehealth: Payer: Self-pay | Admitting: Neurology

## 2016-06-22 NOTE — Telephone Encounter (Signed)
Delia made aware.

## 2016-06-22 NOTE — Telephone Encounter (Signed)
Patient friend Wendi MayaDelia [phone] states she needs to talk to someone about medication and how patient is doing on it

## 2016-06-22 NOTE — Telephone Encounter (Signed)
Spoke with Howard Bender and she states patient has been taking medication as directed - Paxil 20 mg 1/2 tablet every other day and when he takes it he is having burning pain in his head. Please advise.

## 2016-06-22 NOTE — Telephone Encounter (Signed)
Pls advise to stop the medication and to call us back when the burning pain goes away so we can start a different medication. Thanks

## 2016-06-27 ENCOUNTER — Telehealth: Payer: Self-pay

## 2016-06-27 NOTE — Telephone Encounter (Signed)
Pt's friend/transporter/interpretor - clld in advsng pt is feeling much better and its okay to send in new medication.  Advsd I would route message to provider.   Pls advise.

## 2016-06-28 NOTE — Telephone Encounter (Signed)
Pls have him start gabapentin 100mg , take 1 cap at night. Main side effect is drowsiness. Stay on 1 capsule until I see him in follow-up. THanks

## 2016-06-29 NOTE — Telephone Encounter (Signed)
Delia - pt's translator clld  - advsd of new medication Gabapentin 100 mg 1 capsule at night, until he follows up w/her ; main s/e - drowsiness. Delia stated she understood and would advise the pt.

## 2016-06-30 ENCOUNTER — Telehealth: Payer: Self-pay | Admitting: Neurology

## 2016-06-30 MED ORDER — GABAPENTIN 100 MG PO CAPS: 100.0000 mg | ORAL_CAPSULE | Freq: Every day | ORAL | 0 refills | Status: DC

## 2016-06-30 NOTE — Telephone Encounter (Signed)
Howard Bender 1991-03-12. [facility] is [facility] [address]. It is a new medication. His # is [phone]. Thank you

## 2016-06-30 NOTE — Telephone Encounter (Signed)
[facility] [address].

## 2016-06-30 NOTE — Addendum Note (Signed)
Addended by: Areta HaberMOREHEAD, Amayrany Cafaro B on: 06/30/2016 03:47 PM   Modules accepted: Orders

## 2016-07-06 ENCOUNTER — Emergency Department (HOSPITAL_COMMUNITY)
Admission: EM | Admit: 2016-07-06 | Discharge: 2016-07-06 | Disposition: A | Discharge status: Home / Self Care | Payer: Self-pay | Attending: Dermatology | Admitting: Dermatology

## 2016-07-06 DIAGNOSIS — R42 Dizziness and giddiness: Secondary | ICD-10-CM | POA: Insufficient documentation

## 2016-07-06 DIAGNOSIS — Z5321 Procedure and treatment not carried out due to patient leaving prior to being seen by health care provider: Secondary | ICD-10-CM | POA: Insufficient documentation

## 2016-07-06 NOTE — ED Notes (Signed)
Called for Pt to go back to the RM. No answer times 3. Secretary also aware and knows what the pt looks like but does not currently see the pt.

## 2016-07-06 NOTE — ED Notes (Signed)
Pt. Called x 2 more times, No answer.

## 2016-07-06 NOTE — ED Notes (Signed)
No answer

## 2016-07-06 NOTE — ED Triage Notes (Addendum)
Pt reports one week of dizziness and blurred vision off and on. Pt went to [facility] and took his BP pt had a BP reading of 180/100. BP is 127/82. Interpreter being used. Denies pain anywhere. Pt reports several hypertensive readings.

## 2016-07-07 ENCOUNTER — Encounter (HOSPITAL_COMMUNITY): Payer: Self-pay

## 2016-07-07 ENCOUNTER — Emergency Department (HOSPITAL_COMMUNITY)
Admission: EM | Admit: 2016-07-07 | Discharge: 2016-07-07 | Disposition: A | Discharge status: Home / Self Care | Payer: No Typology Code available for payment source | Attending: Emergency Medicine | Admitting: Emergency Medicine

## 2016-07-07 DIAGNOSIS — I10 Essential (primary) hypertension: Secondary | ICD-10-CM

## 2016-07-07 DIAGNOSIS — Z79899 Other long term (current) drug therapy: Secondary | ICD-10-CM | POA: Insufficient documentation

## 2016-07-07 DIAGNOSIS — R110 Nausea: Secondary | ICD-10-CM

## 2016-07-07 LAB — CBC WITH DIFFERENTIAL/PLATELET
Basophils Absolute: 0 10*3/uL (ref 0.0–0.1)
Basophils Relative: 0 %
Eosinophils Absolute: 0 10*3/uL (ref 0.0–0.7)
Eosinophils Relative: 0 %
MCV: 87.1 fL (ref 78.0–100.0)
Monocytes Absolute: 0.8 10*3/uL (ref 0.1–1.0)
Neutro Abs: 5.1 10*3/uL (ref 1.7–7.7)
Neutrophils Relative %: 71 %
Platelets: 275 10*3/uL (ref 150–400)
RDW: 12.8 % (ref 11.5–15.5)

## 2016-07-07 LAB — BASIC METABOLIC PANEL
Anion gap: 8 (ref 5–15)
CO2: 24 mmol/L (ref 22–32)
Chloride: 105 mmol/L (ref 101–111)
GFR calc Af Amer: >60 mL/min (ref >60)
Potassium: 3.9 mmol/L (ref 3.5–5.1)

## 2016-07-07 MED ORDER — KETOROLAC TROMETHAMINE 30 MG/ML IJ SOLN: 30.0000 mg | Freq: Once | INTRAMUSCULAR | Status: DC

## 2016-07-07 MED ORDER — ONDANSETRON HCL 4 MG/2ML IJ SOLN
4.0000 mg | Freq: Once | INTRAMUSCULAR | Status: AC
  Administered 2016-07-07: 4 mg via INTRAVENOUS
  Filled 2016-07-07: qty 2

## 2016-07-07 MED ORDER — SODIUM CHLORIDE 0.9 % IV BOLUS (SEPSIS)
1000.0000 mL | Freq: Once | INTRAVENOUS | Status: AC
  Administered 2016-07-07: 1000 mL via INTRAVENOUS

## 2016-07-07 MED ORDER — ONDANSETRON 8 MG PO TBDP
ORAL_TABLET | ORAL | 0 refills | Status: DC
Start: 2016-07-07 — End: 2019-07-24

## 2016-07-07 NOTE — Discharge Instructions (Signed)
Zofran as prescribed as needed for nausea.  Keep a record of your blood pressures over the next week and follow-up with a primary Dr. to discuss.  Return to the emergency department if your symptoms significantly worsen or change.

## 2016-07-07 NOTE — ED Triage Notes (Signed)
Per Pt through interpreter, Pt is coming from home with complaints of high blood pressure for one week. Complains of nausea, dizziness over the last week that has been intermittent. Yesterday, pt reports BP 175 systolic.

## 2016-07-07 NOTE — ED Provider Notes (Signed)
[facility] Provider Note   CSN: [id number] Arrival date & time: 07/07/16  0827     History   Chief Complaint Chief Complaint  Patient presents with  . Hypertension    HPI Howard Bender is a 26 y.o. male.  Patient is a 26 year old Hispanic male who presents for evaluation of elevated blood pressure, nausea, headache, and generalized malaise. This is been going on for the past week. His blood pressure has been up to 165 at home and is concerned about this. He denies any ill contacts. He denies any abdominal pain, diarrhea, or bloody stool.   The history is provided by the patient.  Hypertension  This is a new problem. Episode onset: 1 week ago. The problem occurs constantly. The problem has been gradually worsening. Pertinent negatives include no chest pain, no abdominal pain and no shortness of breath. Nothing aggravates the symptoms. Nothing relieves the symptoms.    History reviewed. No pertinent past medical history.  There are no active problems to display for this patient.   History reviewed. No pertinent surgical history.     Home Medications    Prior to Admission medications   Medication Sig Start Date End Date Taking? Authorizing Provider  ALPRAZolam Prudy Feeler) 0.5 MG tablet Take 1 tablet (0.5 mg total) by mouth 3 (three) times daily as needed for anxiety. 01/08/16   Elpidio Anis, PA-C  nortriptyline (PAMELOR) 50 MG capsule Take 50 mg by mouth at bedtime.    Historical Provider, MD    Family History No family history on file.  Social History Social History  Substance Use Topics  . Smoking status: Never Smoker  . Smokeless tobacco: Never Used  . Alcohol use No     Allergies   Caffeine   Review of Systems Review of Systems  Respiratory: Negative for shortness of breath.   Cardiovascular: Negative for chest pain.  Gastrointestinal: Negative for abdominal pain.  All other systems reviewed and are negative.    Physical Exam Updated Vital  Signs BP 126/72   Pulse 98   Temp 98.9 F (37.2 C)   Resp 16   Ht 5' 1.02" (1.55 m)   SpO2 100%   Physical Exam  Constitutional: He is oriented to person, place, and time. He appears well-developed and well-nourished. No distress.  HENT:  Head: Normocephalic and atraumatic.  Mouth/Throat: Oropharynx is clear and moist.  Eyes: EOM are normal. Pupils are equal, round, and reactive to light.  Neck: Normal range of motion. Neck supple.  Cardiovascular: Normal rate and regular rhythm.  Exam reveals no friction rub.   No murmur heard. Pulmonary/Chest: Effort normal and breath sounds normal. No respiratory distress. He has no wheezes. He has no rales.  Abdominal: Soft. Bowel sounds are normal. He exhibits no distension. There is no tenderness.  Musculoskeletal: Normal range of motion. He exhibits no edema.  Neurological: He is alert and oriented to person, place, and time. No cranial nerve deficit. He exhibits normal muscle tone. Coordination normal.  Skin: Skin is warm and dry. He is not diaphoretic.  Nursing note and vitals reviewed.    ED Treatments / Results  Labs (all labs ordered are listed, but only abnormal results are displayed) Labs Reviewed  BASIC METABOLIC PANEL  CBC WITH DIFFERENTIAL/PLATELET    EKG  EKG Interpretation None       Radiology No results found.  Procedures Procedures (including critical care time)  Medications Ordered in ED Medications  sodium chloride 0.9 % bolus 1,000 mL (not  administered)  ondansetron (ZOFRAN) injection 4 mg (not administered)  ketorolac (TORADOL) 30 MG/ML injection 30 mg (not administered)     Initial Impression / Assessment and Plan / ED Course  I have reviewed the triage vital signs and the nursing notes.  Pertinent labs & imaging results that were available during my care of the patient were reviewed by me and considered in my medical decision making (see chart for details).  Patient presents here with complaints  as detailed in the history of present illness. His physical examination is unremarkable, vital signs are stable, and laboratory studies are all reassuring. He is concerned about his blood pressure, however it has been very normal while here in the emergency department. I reassured him that nothing today appears emergent. I've advised him to keep a record of his blood pressures at home. If they remain high, he can follow-up with his primary doctor.  Final Clinical Impressions(s) / ED Diagnoses   Final diagnoses:  None    New Prescriptions New Prescriptions   No medications on file     Geoffery Lyonsouglas Sola Margolis, MD 07/07/16 1048

## 2016-07-18 ENCOUNTER — Ambulatory Visit: Payer: Self-pay | Admitting: Neurology

## 2016-07-18 DIAGNOSIS — Z029 Encounter for administrative examinations, unspecified: Secondary | ICD-10-CM

## 2016-07-19 ENCOUNTER — Encounter: Payer: Self-pay | Admitting: Neurology

## 2016-09-05 ENCOUNTER — Encounter (HOSPITAL_COMMUNITY): Payer: Self-pay | Admitting: Family Medicine

## 2016-09-05 ENCOUNTER — Ambulatory Visit (HOSPITAL_COMMUNITY)
Admission: EM | Admit: 2016-09-05 | Discharge: 2016-09-05 | Disposition: A | Discharge status: Home / Self Care | Payer: Self-pay | Attending: Family Medicine | Admitting: Family Medicine

## 2016-09-05 DIAGNOSIS — L600 Ingrowing nail: Secondary | ICD-10-CM

## 2016-09-05 MED ORDER — LIDOCAINE HCL (PF) 2 % IJ SOLN
INTRAMUSCULAR | Status: AC
  Filled 2016-09-05: qty 2

## 2016-09-05 MED ORDER — HYDROCODONE-ACETAMINOPHEN 5-325 MG PO TABS: 1.0000 | ORAL_TABLET | Freq: Four times a day (QID) | ORAL | 0 refills | Status: DC | PRN

## 2016-09-05 NOTE — ED Provider Notes (Signed)
[facility]    CSN: [id number] Arrival date & time: 09/05/16  1214     History   Chief Complaint Chief Complaint  Patient presents with  . Ingrown Toenail    HPI Benji Poynter is a 26 y.o. male.   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 He's had this before and the nail was removed 2 times before.      Past Medical History:  Diagnosis Date  . Anxiety   . Migraines    sees neurologist    Patient Active Problem List   Diagnosis Date Noted  . Dizziness 07/27/2015  . Chronic daily headache 07/08/2014  . Anxiety state 07/08/2014  . Migraine without aura and without status migrainosus, not intractable 01/27/2014  . Headache(784.0) 12/01/2013    History reviewed. No pertinent surgical history.     Home Medications    Prior to Admission medications   Medication Sig Start Date End Date Taking? Authorizing Provider  nortriptyline (PAMELOR) 50 MG capsule Take one capsule at night 02/01/16  Yes Van Clines, MD  gabapentin (NEURONTIN) 100 MG capsule Take 1 capsule (100 mg total) by mouth at bedtime. 06/30/16   Van Clines, MD  HYDROcodone-acetaminophen (NORCO) 5-325 MG tablet Take 1 tablet by mouth every 6 (six) hours as needed for moderate pain. 09/05/16   Elvina Sidle, MD  omeprazole (PRILOSEC) 20 MG capsule Take 1 capsule (20 mg total) by mouth daily. 12/28/15 01/27/16  Caren Griffins, MD  ondansetron (ZOFRAN ODT) 4 MG disintegrating tablet Take 1 tablet (4 mg total) by mouth every 8 (eight) hours as needed for nausea or vomiting. Patient not taking: Reported on 04/20/2016 12/28/15   Caren Griffins, MD  PARoxetine (PAXIL) 20 MG tablet Take 1/2 tablet daily for 2 weeks, then increase to 1 tablet daily 04/20/16   Van Clines, MD    Family History Family History  Problem Relation Age of Onset  . Diabetes Mother   . High blood pressure Mother   . Prostate cancer Maternal Grandfather   .  Cancer Maternal Grandmother     Social History Social History  Substance Use Topics  . Smoking status: Never Smoker  . Smokeless tobacco: Never Used  . Alcohol use No     Allergies   Phenergan [promethazine hcl] and Caffeine   Review of Systems Review of Systems  Musculoskeletal: Positive for gait problem.  All other systems reviewed and are negative.    Physical Exam Triage Vital Signs ED Triage Vitals  Enc Vitals Group     BP 09/05/16 1228 126/79     Pulse Rate 09/05/16 1228 77     Resp 09/05/16 1228 12     Temp 09/05/16 1228 97.4 F (36.3 C)     Temp Source 09/05/16 1228 Oral     SpO2 09/05/16 1228 100 %     Weight --      Height --      Head Circumference --      Peak Flow --      Pain Score 09/05/16 1230 10     Pain Loc --      Pain Edu? --      Excl. in GC? --    No data found.   Updated Vital Signs BP 126/79 (BP Location: Left Arm)   Pulse 77   Temp 97.4 F (36.3 C) (Oral)   Resp 12   SpO2 100%  Physical Exam  Constitutional: He is oriented to person, place, and time. He appears well-developed and well-nourished.  HENT:  Right Ear: External ear normal.  Left Ear: External ear normal.  Eyes: Conjunctivae are normal. Pupils are equal, round, and reactive to light.  Neck: Normal range of motion. Neck supple.  Pulmonary/Chest: Effort normal.  Musculoskeletal: Normal range of motion. He exhibits tenderness.  Neurological: He is alert and oriented to person, place, and time.  Skin: Skin is warm and dry.  Nursing note and vitals reviewed.    UC Treatments / Results  Labs (all labs ordered are listed, but only abnormal results are displayed) Labs Reviewed - No data to display  EKG  EKG Interpretation None       Radiology No results found.  Procedures .Nail Removal Date/Time: 09/05/2016 12:53 PM Performed by: Elvina SidleLAUENSTEIN, Silvester Reierson Authorized by: Elvina SidleLAUENSTEIN, Laetitia Schnepf   Consent:    Consent obtained:  Verbal   Consent given by:   Patient   Risks discussed:  Infection   Alternatives discussed:  No treatment Location:    Foot:  R big toe Pre-procedure details:    Skin preparation:  Betadine Anesthesia (see MAR for exact dosages):    Anesthesia method:  Local infiltration   Local anesthetic:  Lidocaine 1% w/o epi Nail Removal:    Nail removed:  Partial   Nail bed repaired: no     Removed nail replaced and anchored: no   Trephination:    Subungual hematoma drained: no   Ingrown nail:    Wedge excision of skin: no     Nail matrix removed or ablated:  None Post-procedure details:    Dressing:  4x4 sterile gauze   Patient tolerance of procedure:  Tolerated well, no immediate complications   (including critical care time)  Medications Ordered in UC Medications - No data to display   Initial Impression / Assessment and Plan / UC Course  I have reviewed the triage vital signs and the nursing notes.  Pertinent labs & imaging results that were available during my care of the patient were reviewed by me and considered in my medical decision making (see chart for details).      Final Clinical Impressions(s) / UC Diagnoses   Final diagnoses:  Ingrown toenail    New Prescriptions New Prescriptions   HYDROCODONE-ACETAMINOPHEN (NORCO) 5-325 MG TABLET    Take 1 tablet by mouth every 6 (six) hours as needed for moderate pain.     Elvina SidleLauenstein, Aalyiah Camberos, MD 09/05/16 1254

## 2016-09-05 NOTE — ED Triage Notes (Signed)
Patient complains of right great toe,  ingrown toeanail

## 2017-01-23 ENCOUNTER — Encounter: Payer: Self-pay | Admitting: Neurology

## 2017-01-23 ENCOUNTER — Ambulatory Visit (INDEPENDENT_AMBULATORY_CARE_PROVIDER_SITE_OTHER): Payer: Self-pay | Admitting: Neurology

## 2017-01-23 VITALS — BP 122/64 | HR 91 | Ht 64.0 in | Wt 110.0 lb

## 2017-01-23 DIAGNOSIS — G43009 Migraine without aura, not intractable, without status migrainosus: Secondary | ICD-10-CM

## 2017-01-23 DIAGNOSIS — R42 Dizziness and giddiness: Secondary | ICD-10-CM

## 2017-01-23 MED ORDER — NORTRIPTYLINE HCL 50 MG PO CAPS: ORAL_CAPSULE | ORAL | 11 refills | Status: DC

## 2017-01-23 NOTE — Progress Notes (Signed)
NEUROLOGY FOLLOW UP OFFICE NOTE  Howard Bender [id number]  HISTORY OF PRESENT ILLNESS: I had the pleasure of seeing Howard Bender in follow-up in the neurology clinic on 01/23/2017.  The patient was last seen 9 months ago for headaches. He is again accompanied by his friend today who helps supplement the history. A Spanish medical interpreter helps with translation. On his last visit, he was reporting anxiety and pressure in his head like worms or something crawling around in his head. He had an MRI brain without contrast which was normal. He was started on Paxil but had side effects of insomnia and numbness. He was prescribed Gabapentin but does not appear to have taken it. He reports that around 6 months ago, he saw his PCP for elevated BP, and was given instructions for exercise and diet. As he started doing the exercises and diet, he noticed that the sensations in his head resolved. If he would eat a burger or something similar, he would notice the sensations. He denies any headaches, and feels that the nortriptyline  qhs is helping with this. He denies any dizziness, vision changes, focal numbness/tingling/weakness, no falls.   HPI: This is a pleasant 26 yo RH man with no significant past medical history in his usually state of health until the past year when he started having left temporal, bilateral retro-orbital and periorbital (above the eyelids) sharp pain with associated photosensitivity. He reports that pain usually occurs when he walks around, he feels better sitting down and after eating. He feels that symptoms started when he began using his cellphone, with his head bent forward. Extending his head back seemed to help. He reported pain occurs daily, with rarely a day without pain. 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 When the pain first started, he was having associated nausea, which was relieved  with Zofran. He has stopped taking this. He also started having dizziness described as a brief spinning sensation with head movement, resolving when head is still. He also had black floaters in his vision. He had seen an optometrist twice and was told vision was okay. He had seen ENT, evaluation unremarkable, and symptoms felt to be due to migraine. He took 2 tablets of Excedrin migraine one time, however had side effects of whole body numbness, leading to an visit to [facility] ER last 08/2013 where head CT done which I personally reviewed was unremarkable. He initially reported improvement in symptoms with nortriptyline  qhs. 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 There is no family history of headaches.  PAST MEDICAL HISTORY: Past Medical History:  Diagnosis Date  . Anxiety   . Migraines    sees neurologist    MEDICATIONS:  Outpatient Encounter Prescriptions as of 01/23/2017  Medication Sig  . nortriptyline (PAMELOR) 50 MG capsule Take one capsule at night  . [DISCONTINUED] nortriptyline (PAMELOR) 50 MG capsule Take one capsule at night  . [DISCONTINUED] gabapentin (NEURONTIN) 100 MG capsule Take 1 capsule (100 mg total) by mouth at bedtime.  . [DISCONTINUED] HYDROcodone-acetaminophen (NORCO) 5-325 MG tablet Take 1 tablet by mouth every 6 (six) hours as needed for moderate pain.  . [DISCONTINUED] omeprazole (PRILOSEC) 20 MG capsule Take 1 capsule (20 mg total) by mouth daily.  . [DISCONTINUED] ondansetron (ZOFRAN ODT) 4 MG disintegrating tablet Take 1 tablet (4 mg total) by mouth every 8 (eight)  hours as needed for nausea or vomiting. (Patient not taking: Reported on 04/20/2016)  . [DISCONTINUED] PARoxetine (PAXIL) 20 MG tablet Take 1/2 tablet daily for 2 weeks, then increase to 1 tablet daily   No facility-administered encounter medications on file as of 01/23/2017.     ALLERGIES: Allergies  Allergen Reactions  . Phenergan [Promethazine Hcl] Anxiety and  Palpitations  . Caffeine Nausea And Vomiting    FAMILY HISTORY: Family History  Problem Relation Age of Onset  . Diabetes Mother   . High blood pressure Mother   . Prostate cancer Maternal Grandfather   . Cancer Maternal Grandmother     SOCIAL HISTORY: Social History   Social History  . Marital status: Married    Spouse name: N/A  . Number of children: 1  . Years of education: N/A   Occupational History  . cook    Social History Main Topics  . Smoking status: Never Smoker  . Smokeless tobacco: Never Used  . Alcohol use No  . Drug use: Yes    Types: Marijuana  . Sexual activity: Not on file   Other Topics Concern  . Not on file   Social History Narrative   ** Merged History Encounter **        REVIEW OF SYSTEMS: Constitutional: No fevers, chills, or sweats, no generalized fatigue, change in appetite Eyes: No visual changes, double vision, eye pain Ear, nose and throat: No hearing loss, ear pain, nasal congestion, sore throat Cardiovascular: No chest pain, palpitations Respiratory:  No shortness of breath at rest or with exertion, wheezes GastrointestinaI: No nausea, vomiting, diarrhea, abdominal pain, fecal incontinence Genitourinary:  No dysuria, urinary retention or frequency Musculoskeletal:  No neck pain, back pain Integumentary: No rash, pruritus, skin lesions Neurological: as above Psychiatric: No depression, insomnia, anxiety Endocrine: No palpitations, fatigue, diaphoresis, mood swings, change in appetite, change in weight, increased thirst Hematologic/Lymphatic:  No anemia, purpura, petechiae. Allergic/Immunologic: no itchy/runny eyes, nasal congestion, recent allergic reactions, rashes  PHYSICAL EXAM: Vitals:   01/23/17 0842  BP: 122/64  Pulse: 91  SpO2: 99%   General: No acute distress Head:  Normocephalic/atraumatic Neck: supple, no paraspinal tenderness, full range of motion Heart:  Regular rate and rhythm Lungs:  Clear to auscultation  bilaterally Back: No paraspinal tenderness Skin/Extremities: No rash, no edema Neurological Exam: alert and oriented to person, place, and time. No aphasia or dysarthria. Fund of knowledge is appropriate.  Recent and remote memory are intact.  Attention and concentration are normal.    Able to name objects and repeat phrases. Cranial nerves: Pupils equal, round, reactive to light. Extraocular movements intact with no nystagmus. Visual fields full. Facial sensation intact. No facial asymmetry. Tongue, uvula, palate midline.  Motor: Bulk and tone normal, muscle strength 5/5 throughout with no pronator drift.  Sensation to light touch intact.  No extinction to double simultaneous stimulation.  Deep tendon reflexes 2+ throughout, toes downgoing.  Finger to nose testing intact.  Gait narrow-based and steady, able to tandem walk adequately.  Romberg negative.  IMPRESSION: This is a pleasant 26 yo RH man who presented with chronic daily headaches in 2015. He continues on nortriptyline  qhs with no headaches. He was reporting paresthesias in his head and anxiety on last visit, had side effects on Paxil, unclear if he took gabapentin. MRI brain normal. He reports these sensations have resolved with exercise and diet. He will follow-up in 6 months and knows to call our office for any problems in the interim.  Thank you for allowing me to participate in his care.  Please do not hesitate to call for any questions or concerns.  The duration of this appointment visit was 15 minutes of face-to-face time with the patient.  Greater than 50% of this time was spent in counseling, explanation of diagnosis, planning of further management, and coordination of care.   Patrcia Dolly, M.D.   CC: Dr. Lonzo Cloud

## 2017-01-23 NOTE — Patient Instructions (Signed)
1. Continue nortriptyline  daily 2. Continue with exercise and diet 3. Follow-up in 6 months, call for any changes

## 2017-06-15 ENCOUNTER — Telehealth: Payer: Self-pay | Admitting: Neurology

## 2017-06-15 NOTE — Telephone Encounter (Signed)
[facility]  Returned call to Delia and let her know to contact pharmacy for refills.

## 2017-06-15 NOTE — Telephone Encounter (Signed)
Patient is out of his medication that he takes every night. [facility] [address]. Patient is scheduled on 07/24/17. He would like a refill until that appt? Please Call. Thanks

## 2017-07-15 ENCOUNTER — Encounter (HOSPITAL_COMMUNITY): Payer: Self-pay

## 2017-07-15 ENCOUNTER — Emergency Department (HOSPITAL_COMMUNITY)
Admission: EM | Admit: 2017-07-15 | Discharge: 2017-07-15 | Disposition: A | Discharge status: Home / Self Care | Payer: Self-pay | Attending: Emergency Medicine | Admitting: Emergency Medicine

## 2017-07-15 DIAGNOSIS — R1084 Generalized abdominal pain: Secondary | ICD-10-CM | POA: Insufficient documentation

## 2017-07-15 DIAGNOSIS — R109 Unspecified abdominal pain: Secondary | ICD-10-CM

## 2017-07-15 DIAGNOSIS — R110 Nausea: Secondary | ICD-10-CM | POA: Insufficient documentation

## 2017-07-15 DIAGNOSIS — Z79899 Other long term (current) drug therapy: Secondary | ICD-10-CM | POA: Insufficient documentation

## 2017-07-15 LAB — COMPREHENSIVE METABOLIC PANEL
ALBUMIN: 3.9 g/dL (ref 3.5–5.0)
ALT: 27 U/L (ref 17–63)
ANION GAP: 10 (ref 5–15)
AST: 30 U/L (ref 15–41)
Alkaline Phosphatase: 75 U/L (ref 38–126)
BILIRUBIN TOTAL: 0.8 mg/dL (ref 0.3–1.2)
BUN: 18 mg/dL (ref 6–20)
CO2: 24 mmol/L (ref 22–32)
CREATININE: 0.77 mg/dL (ref 0.61–1.24)
Calcium: 8.9 mg/dL (ref 8.9–10.3)
Chloride: 100 mmol/L — ABNORMAL LOW (ref 101–111)
GFR calc Af Amer: >60 mL/min (ref >60)
GFR calc non Af Amer: >60 mL/min (ref >60)
GLUCOSE: 108 mg/dL — AB (ref 65–99)
Potassium: 3.9 mmol/L (ref 3.5–5.1)
SODIUM: 134 mmol/L — AB (ref 135–145)
TOTAL PROTEIN: 6.4 g/dL — AB (ref 6.5–8.1)

## 2017-07-15 LAB — URINALYSIS, ROUTINE W REFLEX MICROSCOPIC
Bilirubin Urine: NEGATIVE
GLUCOSE, UA: NEGATIVE mg/dL
HGB URINE DIPSTICK: NEGATIVE
KETONES UR: NEGATIVE mg/dL
LEUKOCYTES UA: NEGATIVE
Nitrite: NEGATIVE
PROTEIN: NEGATIVE mg/dL
Specific Gravity, Urine: 1.013 (ref 1.005–1.030)
pH: 8 (ref 5.0–8.0)

## 2017-07-15 LAB — CBC
HCT: 41.4 % (ref 39.0–52.0)
Hemoglobin: 14.2 g/dL (ref 13.0–17.0)
MCH: 31.3 pg (ref 26.0–34.0)
MCHC: 34.3 g/dL (ref 30.0–36.0)
MCV: 91.2 fL (ref 78.0–100.0)
PLATELETS: 257 10*3/uL (ref 150–400)
RBC: 4.54 MIL/uL (ref 4.22–5.81)
RDW: 12.7 % (ref 11.5–15.5)
WBC: 8 10*3/uL (ref 4.0–10.5)

## 2017-07-15 LAB — LIPASE, BLOOD: Lipase: 29 U/L (ref 11–51)

## 2017-07-15 MED ORDER — ONDANSETRON 4 MG PO TBDP: ORAL_TABLET | ORAL | 0 refills | Status: DC

## 2017-07-15 MED ORDER — FAMOTIDINE 20 MG PO TABS: 20.0000 mg | ORAL_TABLET | Freq: Two times a day (BID) | ORAL | 0 refills | Status: DC

## 2017-07-15 NOTE — Discharge Instructions (Signed)
Try zofran for nausea. Try tylenol or pepcid for abdominal pain.  Return for worsening symptoms or new symptoms.

## 2017-07-15 NOTE — ED Provider Notes (Signed)
[facility] Provider Note   CSN: [id number] Arrival date & time: 07/15/17  1336     History   Chief Complaint No chief complaint on file.   HPI Howard Bender is a 27 y.o. male.  Patient presents with intermittent mild upper abdominal discomfort and primarily nausea for the past 4 days. This is been since he ate pizza. No history of abdominal surgeries or significant pathology. Patient normal bowel movement and intermittent loose stools.     Past Medical History:  Diagnosis Date  . Anxiety   . Migraines    sees neurologist    Patient Active Problem List   Diagnosis Date Noted  . Dizziness 07/27/2015  . Chronic daily headache 07/08/2014  . Anxiety state 07/08/2014  . Migraine without aura and without status migrainosus, not intractable 01/27/2014  . Headache(784.0) 12/01/2013    History reviewed. No pertinent surgical history.      Home Medications    Prior to Admission medications   Medication Sig Start Date End Date Taking? Authorizing Provider  famotidine (PEPCID) 20 MG tablet Take 1 tablet (20 mg total) by mouth 2 (two) times daily. 07/15/17   Blane OharaZavitz, Vann Okerlund, MD  nortriptyline (PAMELOR) 50 MG capsule Take one capsule at night 01/23/17   Van ClinesAquino, Karen M, MD  ondansetron (ZOFRAN ODT) 4 MG disintegrating tablet 4mg  ODT q4 hours prn nausea/vomit 07/15/17   Blane OharaZavitz, Yadiel Aubry, MD    Family History Family History  Problem Relation Age of Onset  . Diabetes Mother   . High blood pressure Mother   . Prostate cancer Maternal Grandfather   . Cancer Maternal Grandmother     Social History Social History   Tobacco Use  . Smoking status: Never Smoker  . Smokeless tobacco: Never Used  Substance Use Topics  . Alcohol use: No    Alcohol/week: 0.0 oz  . Drug use: Yes    Types: Marijuana     Allergies   Phenergan [promethazine hcl] and Caffeine   Review of Systems Review of Systems  Constitutional: Positive for  appetite change. Negative for chills and fever.  HENT: Negative for congestion.   Eyes: Negative for visual disturbance.  Respiratory: Negative for shortness of breath.   Cardiovascular: Negative for chest pain.  Gastrointestinal: Positive for nausea. Negative for abdominal pain and vomiting.  Genitourinary: Negative for dysuria and flank pain.  Musculoskeletal: Negative for back pain, neck pain and neck stiffness.  Skin: Negative for rash.  Neurological: Negative for light-headedness and headaches.     Physical Exam Updated Vital Signs BP 131/72 (BP Location: Right Arm)   Pulse 78   Temp 98.3 F (36.8 C) (Oral)   Resp 16   Ht 5\' 4"  (1.626 m)   SpO2 98%   BMI 18.88 kg/m   Physical Exam  Constitutional: He is oriented to person, place, and time. He appears well-developed and well-nourished.  HENT:  Head: Normocephalic and atraumatic.  Eyes: Conjunctivae are normal. Right eye exhibits no discharge. Left eye exhibits no discharge.  Neck: Normal range of motion. Neck supple. No tracheal deviation present.  Cardiovascular: Normal rate and regular rhythm.  Pulmonary/Chest: Effort normal and breath sounds normal.  Abdominal: Soft. He exhibits no distension. There is no tenderness. There is no guarding.  Musculoskeletal: He exhibits no edema.  Neurological: He is alert and oriented to person, place, and time.  Skin: Skin is warm. No rash noted.  Psychiatric: He has a normal mood and affect.  Nursing note and vitals  reviewed.    ED Treatments / Results  Labs (all labs ordered are listed, but only abnormal results are displayed) Labs Reviewed  COMPREHENSIVE METABOLIC PANEL - Abnormal; Notable for the following components:      Result Value   Sodium 134 (*)    Chloride 100 (*)    Glucose, Bld 108 (*)    Total Protein 6.4 (*)    All other components within normal limits  LIPASE, BLOOD  CBC  URINALYSIS, ROUTINE W REFLEX MICROSCOPIC    EKG None  Radiology No results  found.  Procedures Procedures (including critical care time)  Medications Ordered in ED Medications - No data to display   Initial Impression / Assessment and Plan / ED Course  I have reviewed the triage vital signs and the nursing notes.  Pertinent labs & imaging results that were available during my care of the patient were reviewed by me and considered in my medical decision making (see chart for details).    Patient presents with nausea and transient abdominal discomfort. Currently no abdominal tenderness on exam, vitals and blood work reviewed within normal limits. Discussed trial of Pepcid and Zofran as needed for nausea with outpatient follow-up. Interpreter used Results and differential diagnosis were discussed with the patient/parent/guardian. Xrays were independently reviewed by myself.  Close follow up outpatient was discussed, comfortable with the plan.   Medications - No data to display  Vitals:   07/15/17 1341 07/15/17 1342 07/15/17 1607 07/15/17 2133  BP:  125/67 131/72 132/74  Pulse:  79 78 94  Resp:  14 16 20   Temp:  98.3 F (36.8 C)  98.5 F (36.9 C)  TempSrc:  Oral  Oral  SpO2:  99% 98% 100%  Height: 5\' 4"  (1.626 m)       Final diagnoses:  Abdominal pain, unspecified abdominal location  Nausea     Final Clinical Impressions(s) / ED Diagnoses   Final diagnoses:  Abdominal pain, unspecified abdominal location  Nausea    ED Discharge Orders        Ordered    famotidine (PEPCID) 20 MG tablet  2 times daily     07/15/17 2128    ondansetron (ZOFRAN ODT) 4 MG disintegrating tablet     07/15/17 2128       Blane Ohara, MD 07/15/17 2212

## 2017-07-15 NOTE — ED Triage Notes (Signed)
Patient complains of abdominal pain x 4 days after eating pizza. States that the pain is intermittent. Nausea without vomiting. Per interpretor hasn't had this pain before. Complains of feeling bloated. No constipation. Normal BM but also reports intermittent loose stool earlier

## 2017-07-19 ENCOUNTER — Ambulatory Visit: Payer: Self-pay | Admitting: Neurology

## 2017-07-24 ENCOUNTER — Encounter: Payer: Self-pay | Admitting: Neurology

## 2017-07-24 ENCOUNTER — Other Ambulatory Visit: Payer: Self-pay

## 2017-07-24 ENCOUNTER — Ambulatory Visit (INDEPENDENT_AMBULATORY_CARE_PROVIDER_SITE_OTHER): Payer: Self-pay | Admitting: Neurology

## 2017-07-24 DIAGNOSIS — G43009 Migraine without aura, not intractable, without status migrainosus: Secondary | ICD-10-CM

## 2017-07-24 MED ORDER — NORTRIPTYLINE HCL 50 MG PO CAPS: ORAL_CAPSULE | ORAL | 11 refills | Status: DC

## 2017-07-24 NOTE — Patient Instructions (Signed)
Great seeing you! Continue nortriptyline 50mg  every night. Call for any changes. Follow-up in 1 year.

## 2017-07-24 NOTE — Progress Notes (Signed)
NEUROLOGY FOLLOW UP OFFICE NOTE  Raeford Brandenburg [id number] DOB: Jul 10, 1990  HISTORY OF PRESENT ILLNESS: I had the pleasure of seeing Axiel Fjeld in follow-up in the neurology clinic on 07/24/2017.  The patient was last seen 6 months ago for headaches. He is again accompanied by his friend today who helps supplement the history and provide translation. He filled out interpreter waiver today. Since his last visit, he continues to do well with no further headaches on nortriptyline 50mg  qhs. He was previously reporting anxiety and pressure in his head like worms or something crawling in his head. MRI brain normal. Since doing exercises and better diet, he noticed the head sensations resolved. He denies any dizziness, vision changes, focal numbness/tingling/weakness, no falls.   HPI: This is a pleasant 27 yo RH man with no significant past medical history in his usually state of health until the past year when he started having left temporal, bilateral retro-orbital and periorbital (above the eyelids) sharp pain with associated photosensitivity. He reports that pain usually occurs when he walks around, he feels better sitting down and after eating. He feels that symptoms started when he began using his cellphone, with his head bent forward. Extending his head back seemed to help. He reported pain occurs daily, with rarely a day without pain. 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 When the pain first started, he was having associated nausea, which was relieved with Zofran. He has stopped taking this. He also started having dizziness described as a brief spinning sensation with head movement, resolving when head is still. He also had black floaters in his vision. He had seen an optometrist twice and was told vision was okay. He had seen ENT, evaluation unremarkable, and symptoms felt to be due to migraine. He took 2 tablets  of Excedrin migraine one time, however had side effects of whole body numbness, leading to an visit to [facility] last 08/2013 where head CT done which I personally reviewed was unremarkable. He initially reported improvement in symptoms with nortriptyline 30mg  qhs. 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 There is no family history of headaches.  PAST MEDICAL HISTORY: Past Medical History:  Diagnosis Date  . Anxiety   . Migraines    sees neurologist    MEDICATIONS:  Outpatient Encounter Medications as of 07/24/2017  Medication Sig  . famotidine (PEPCID) 20 MG tablet Take 1 tablet (20 mg total) by mouth 2 (two) times daily.  . nortriptyline (PAMELOR) 50 MG capsule Take one capsule at night  . ondansetron (ZOFRAN ODT) 4 MG disintegrating tablet 4mg  ODT q4 hours prn nausea/vomit   No facility-administered encounter medications on file as of 07/24/2017.     ALLERGIES: Allergies  Allergen Reactions  . Phenergan [Promethazine Hcl] Anxiety and Palpitations  . Caffeine Nausea And Vomiting    FAMILY HISTORY: Family History  Problem Relation Age of Onset  . Diabetes Mother   . High blood pressure Mother   . Prostate cancer Maternal Grandfather   . Cancer Maternal Grandmother     SOCIAL HISTORY: Social History   Socioeconomic History  . Marital status: Married    Spouse name: Not on file  . Number of children: 1  . Years of education: Not on file  . Highest education level: Not on file  Occupational History  . Occupation: Financial risk analyst  Social Needs  .  Financial resource strain: Not on file  . Food insecurity:    Worry: Not on file    Inability: Not on file  . Transportation needs:    Medical: Not on file    Non-medical: Not on file  Tobacco Use  . Smoking status: Never Smoker  . Smokeless tobacco: Never Used  Substance and Sexual Activity  . Alcohol use: No    Alcohol/week: 0.0 oz  . Drug use: Yes    Types: Marijuana  . Sexual activity: Not on  file  Lifestyle  . Physical activity:    Days per week: Not on file    Minutes per session: Not on file  . Stress: Not on file  Relationships  . Social connections:    Talks on phone: Not on file    Gets together: Not on file    Attends religious service: Not on file    Active member of club or organization: Not on file    Attends meetings of clubs or organizations: Not on file    Relationship status: Not on file  . Intimate partner violence:    Fear of current or ex partner: Not on file    Emotionally abused: Not on file    Physically abused: Not on file    Forced sexual activity: Not on file  Other Topics Concern  . Not on file  Social History Narrative   ** Merged History Encounter **        REVIEW OF SYSTEMS: Constitutional: No fevers, chills, or sweats, no generalized fatigue, change in appetite Eyes: No visual changes, double vision, eye pain Ear, nose and throat: No hearing loss, ear pain, nasal congestion, sore throat Cardiovascular: No chest pain, palpitations Respiratory:  No shortness of breath at rest or with exertion, wheezes GastrointestinaI: No nausea, vomiting, diarrhea, abdominal pain, fecal incontinence Genitourinary:  No dysuria, urinary retention or frequency Musculoskeletal:  No neck pain, back pain Integumentary: No rash, pruritus, skin lesions Neurological: as above Psychiatric: No depression, insomnia, anxiety Endocrine: No palpitations, fatigue, diaphoresis, mood swings, change in appetite, change in weight, increased thirst Hematologic/Lymphatic:  No anemia, purpura, petechiae. Allergic/Immunologic: no itchy/runny eyes, nasal congestion, recent allergic reactions, rashes  PHYSICAL EXAM: Vitals:   07/24/17 0836  BP: 110/72  Pulse: 81  SpO2: 98%   General: No acute distress Head:  Normocephalic/atraumatic Neck: supple, no paraspinal tenderness, full range of motion Heart:  Regular rate and rhythm Lungs:  Clear to auscultation  bilaterally Back: No paraspinal tenderness Skin/Extremities: No rash, no edema Neurological Exam: alert and oriented to person, place, and time. No aphasia or dysarthria. Fund of knowledge is appropriate.  Recent and remote memory are intact.  Attention and concentration are normal.    Able to name objects and repeat phrases. Cranial nerves: Pupils equal, round, reactive to light. Extraocular movements intact with no nystagmus. Visual fields full. Facial sensation intact. No facial asymmetry. Tongue, uvula, palate midline.  Motor: Bulk and tone normal, muscle strength 5/5 throughout with no pronator drift.  Sensation to light touch intact.  No extinction to double simultaneous stimulation.  Deep tendon reflexes 2+ throughout, toes downgoing.  Finger to nose testing intact.  Gait narrow-based and steady, able to tandem walk adequately.  Romberg negative.  IMPRESSION: This is a pleasant 27 yo RH man who presented with chronic daily headaches in 2015. He continues on nortriptyline 50mg  qhs with no further headaches. He was reporting paresthesias in his head and anxiety and continues to report these sensations have  resolved with exercise and diet. He will follow-up in 1 year and knows to call our office for any problems in the interim.  Thank you for allowing me to participate in his care.  Please do not hesitate to call for any questions or concerns.  The duration of this appointment visit was 15 minutes of face-to-face time with the patient.  Greater than 50% of this time was spent in counseling, explanation of diagnosis, planning of further management, and coordination of care.   Patrcia DollyKaren Philomena Buttermore, M.D.   CC: Dr. Lonzo CloudShamleffer

## 2017-12-01 ENCOUNTER — Encounter (HOSPITAL_COMMUNITY): Payer: Self-pay

## 2017-12-01 ENCOUNTER — Emergency Department (HOSPITAL_COMMUNITY)
Admission: EM | Admit: 2017-12-01 | Discharge: 2017-12-01 | Disposition: A | Discharge status: Home / Self Care | Payer: Self-pay | Attending: Emergency Medicine | Admitting: Emergency Medicine

## 2017-12-01 ENCOUNTER — Other Ambulatory Visit: Payer: Self-pay

## 2017-12-01 DIAGNOSIS — R197 Diarrhea, unspecified: Secondary | ICD-10-CM | POA: Insufficient documentation

## 2017-12-01 DIAGNOSIS — Z79899 Other long term (current) drug therapy: Secondary | ICD-10-CM | POA: Insufficient documentation

## 2017-12-01 DIAGNOSIS — R110 Nausea: Secondary | ICD-10-CM | POA: Insufficient documentation

## 2017-12-01 DIAGNOSIS — F419 Anxiety disorder, unspecified: Secondary | ICD-10-CM | POA: Insufficient documentation

## 2017-12-01 LAB — COMPREHENSIVE METABOLIC PANEL
ALBUMIN: 4.1 g/dL (ref 3.5–5.0)
ALT: 31 U/L (ref 0–44)
ANION GAP: 8 (ref 5–15)
AST: 33 U/L (ref 15–41)
Alkaline Phosphatase: 65 U/L (ref 38–126)
BUN: 18 mg/dL (ref 6–20)
CHLORIDE: 105 mmol/L (ref 98–111)
CO2: 24 mmol/L (ref 22–32)
Calcium: 9 mg/dL (ref 8.9–10.3)
Creatinine, Ser: 0.85 mg/dL (ref 0.61–1.24)
GFR calc Af Amer: >60 mL/min (ref >60)
GFR calc non Af Amer: >60 mL/min (ref >60)
Glucose, Bld: 89 mg/dL (ref 70–99)
POTASSIUM: 4 mmol/L (ref 3.5–5.1)
Sodium: 137 mmol/L (ref 135–145)
Total Bilirubin: 0.6 mg/dL (ref 0.3–1.2)
Total Protein: 6.9 g/dL (ref 6.5–8.1)

## 2017-12-01 LAB — URINALYSIS, ROUTINE W REFLEX MICROSCOPIC
Bilirubin Urine: NEGATIVE
GLUCOSE, UA: NEGATIVE mg/dL
Hgb urine dipstick: NEGATIVE
KETONES UR: 5 mg/dL — AB
LEUKOCYTES UA: NEGATIVE
Nitrite: NEGATIVE
PH: 6 (ref 5.0–8.0)
Protein, ur: NEGATIVE mg/dL
SPECIFIC GRAVITY, URINE: 1.009 (ref 1.005–1.030)

## 2017-12-01 LAB — CBC
HEMATOCRIT: 44.9 % (ref 39.0–52.0)
HEMOGLOBIN: 15.1 g/dL (ref 13.0–17.0)
MCH: 30.8 pg (ref 26.0–34.0)
MCHC: 33.6 g/dL (ref 30.0–36.0)
MCV: 91.4 fL (ref 78.0–100.0)
Platelets: 241 10*3/uL (ref 150–400)
RBC: 4.91 MIL/uL (ref 4.22–5.81)
RDW: 12.4 % (ref 11.5–15.5)
WBC: 7.1 10*3/uL (ref 4.0–10.5)

## 2017-12-01 LAB — LIPASE, BLOOD: LIPASE: 29 U/L (ref 11–51)

## 2017-12-01 MED ORDER — ONDANSETRON HCL 4 MG PO TABS: 4.0000 mg | ORAL_TABLET | Freq: Four times a day (QID) | ORAL | 0 refills | Status: DC

## 2017-12-01 NOTE — ED Notes (Signed)
EDP at bedside for assessment with interpreter machine.

## 2017-12-01 NOTE — ED Provider Notes (Signed)
[facility]   CSN: [id number] Arrival date & time: 12/01/17  1358     History   Chief Complaint Chief Complaint  Patient presents with  . Abdominal Pain    HPI Angelena Formbel Freimuth is a 27 y.o. male past medical history of anxiety, migraines who presents for evaluation of nausea that has been ongoing for last month.  Patient states that he has had persistent nausea over the last month.  He saw his primary care doctor approximately 2 and half weeks ago nurse prescribed Phenergan which he states he has been taking with no improvement.  He states he has not had any episodes of vomiting.  He is still been able to eat and drink without any difficulty.  He reports he had diarrhea that began yesterday.  He reports multiple episodes yesterday.  No blood noted in stool.  He has had 3 episodes today.  He denies any recent antibiotic use or travel outside of the country.  Patient states that he has no abdominal pain with this nausea.  He states he had a little bit of cramping yesterday prior to onset of diarrhea but that has resolved.  Patient denies any history of stomach issues.  Patient denies any fevers, chest pain, difficulty breathing, urinary symptoms, night sweats, weight loss.  The history is provided by the patient. A language interpreter was used.    Past Medical History:  Diagnosis Date  . Anxiety   . Migraines    sees neurologist    Patient Active Problem List   Diagnosis Date Noted  . Dizziness 07/27/2015  . Chronic daily headache 07/08/2014  . Anxiety state 07/08/2014  . Migraine without aura and without status migrainosus, not intractable 01/27/2014  . Headache(784.0) 12/01/2013    History reviewed. No pertinent surgical history.      Home Medications    Prior to Admission medications   Medication Sig Start Date End Date Taking? Authorizing Provider  famotidine (PEPCID) 20 MG tablet Take 1 tablet (20 mg total) by  mouth 2 (two) times daily. 07/15/17   Blane OharaZavitz, Joshua, MD  nortriptyline (PAMELOR) 50 MG capsule Take one capsule at night 07/24/17   Van ClinesAquino, Karen M, MD  ondansetron (ZOFRAN ODT) 4 MG disintegrating tablet 4mg  ODT q4 hours prn nausea/vomit 07/15/17   Blane OharaZavitz, Joshua, MD  ondansetron (ZOFRAN) 4 MG tablet Take 1 tablet (4 mg total) by mouth every 6 (six) hours. 12/01/17   Maxwell CaulLayden, Angelyn Osterberg A, PA-C    Family History Family History  Problem Relation Age of Onset  . Diabetes Mother   . High blood pressure Mother   . Prostate cancer Maternal Grandfather   . Cancer Maternal Grandmother     Social History Social History   Tobacco Use  . Smoking status: Never Smoker  . Smokeless tobacco: Never Used  Substance Use Topics  . Alcohol use: No    Alcohol/week: 0.0 standard drinks  . Drug use: Yes    Types: Marijuana     Allergies   Phenergan [promethazine hcl] and Caffeine   Review of Systems Review of Systems  Constitutional: Negative for diaphoresis, fever and unexpected weight change.  Respiratory: Negative for cough and shortness of breath.   Cardiovascular: Negative for chest pain.  Gastrointestinal: Positive for diarrhea and nausea. Negative for abdominal pain, blood in stool and vomiting.  Genitourinary: Negative for dysuria and hematuria.  Neurological: Negative for headaches.  All other systems reviewed and are negative.    Physical Exam  Updated Vital Signs BP 137/70 (BP Location: Right Arm)   Pulse 86   Temp 97.8 F (36.6 C) (Oral)   Resp 16   SpO2 100%   Physical Exam  Constitutional: He is oriented to person, place, and time. He appears well-developed and well-nourished.  HENT:  Head: Normocephalic and atraumatic.  Mouth/Throat: Oropharynx is clear and moist and mucous membranes are normal.  Eyes: Pupils are equal, round, and reactive to light. Conjunctivae, EOM and lids are normal.  Neck: Full passive range of motion without pain.  Cardiovascular: Normal rate,  regular rhythm, normal heart sounds and normal pulses. Exam reveals no gallop and no friction rub.  No murmur heard. Pulmonary/Chest: Effort normal and breath sounds normal.  Abdominal: Soft. Normal appearance. There is no tenderness. There is no rigidity and no guarding.  Abdomen is soft, non-distended, non-tender. No rigidity, No guarding. No peritoneal signs.  Musculoskeletal: Normal range of motion.  Neurological: He is alert and oriented to person, place, and time.  Skin: Skin is warm and dry. Capillary refill takes less than 2 seconds.  Psychiatric: He has a normal mood and affect. His speech is normal.  Nursing note and vitals reviewed.    ED Treatments / Results  Labs (all labs ordered are listed, but only abnormal results are displayed) Labs Reviewed  URINALYSIS, ROUTINE W REFLEX MICROSCOPIC - Abnormal; Notable for the following components:      Result Value   Color, Urine STRAW (*)    Ketones, ur 5 (*)    All other components within normal limits  LIPASE, BLOOD  COMPREHENSIVE METABOLIC PANEL  CBC    EKG None  Radiology No results found.  Procedures Procedures (including critical care time)  Medications Ordered in ED Medications - No data to display   Initial Impression / Assessment and Plan / ED Course  I have reviewed the triage vital signs and the nursing notes.  Pertinent labs & imaging results that were available during my care of the patient were reviewed by me and considered in my medical decision making (see chart for details).     27 year old male who presents for evaluation of nausea x1 month and diarrhea that began yesterday.  No vomiting, blood in stool, fevers, weight loss, night sweats.  Reports seen by his PCP 2 weeks concern of Phenergan with minimal improvement. Patient is afebrile, non-toxic appearing, sitting comfortably on examination table. Vital signs reviewed and stable.  On exam, abdomen is soft, nondistended with no tenderness.  Low  suspicion for infectious etiology at this point given duration of symptoms.  Question anxiety versus IBS versus GI etiology.  Initial labs ordered at triage.  CBC without any significant leukocytosis, anemia.  Lipase unremarkable.  CMP is without any abnormalities.  UA shows no signs of infection.  Discussed results with patient.  He is not having any abdominal pain at this time.  His abdominal exam is benign with no signs of tenderness, rigidity, guarding that would be concerning for appendicitis, diverticular-itis, perforation, obstruction.  Tolerating p.o. without any difficulty.  No indication for CT abdomen pelvis at this time given reassuring work-up.  We will plan to switch to Zofran for antiemetics.  We will plan to give him outpatient GI referral for follow-up for further evaluation if symptoms persist. At this time, patient exhibits no emergent life-threatening condition that require further evaluation in ED. Patient had ample opportunity for questions and discussion. All patient's questions were answered with full understanding. Strict return precautions discussed. Patient expresses  understanding and agreement to plan.   Final Clinical Impressions(s) / ED Diagnoses   Final diagnoses:  Nausea  Diarrhea, unspecified type    ED Discharge Orders         Ordered    ondansetron (ZOFRAN) 4 MG tablet  Every 6 hours     12/01/17 2119           Maxwell CaulLayden, Yamil Dougher A, PA-C 12/01/17 2334    Tegeler, Canary Brimhristopher J, MD 12/02/17 [phone]

## 2017-12-01 NOTE — Discharge Instructions (Signed)
Take zofran for nausea.   Foods such as bread, rice, applesauce, toast will help with diarrhea.  Please follow-up with the referred GI doctor for further evaluation.  Return the emergency department for any fever, abdominal pain, vomiting, difficulty eating or drinking, blood in your stools or any other worsening or concerning symptoms.

## 2017-12-01 NOTE — ED Provider Notes (Signed)
Patient placed in Quick Look pathway, seen and evaluated   Chief Complaint: nausea, diarrhea  HPI: Patient presents with frequent episodes of nausea starting 1 month ago, diarrhea more recently.  No blood noted in the stool.  He has not had any vomiting.  He has occasional mild cramping in the abdomen.  No previous history of surgeries.  He states that he is seen his primary care doctor was prescribed Phenergan which has not helped him.  No urinary symptoms.  ROS:  Positive ROS: (+) Nausea, diarrhea Negative ROS: (-) Fever, vomiting hematochezia  Physical Exam:   Gen: No distress  Neuro: Awake and Alert  Skin: Warm    Focused Exam: Heart RRR, nml S1,S2, no m/r/g; Lungs CTAB; Abd soft, NT, no rebound or guarding; Ext 2+ pedal pulses bilaterally, no edema.  BP 130/80 (BP Location: Right Arm)   Pulse 98   Temp 98.5 F (36.9 C) (Oral)   Resp 18   SpO2 100%   Plan: We will check CBC, electrolytes  Initiation of care has begun. The patient has been counseled on the process, plan, and necessity for staying for the completion/evaluation, and the remainder of the medical screening examination    Renne CriglerGeiple, Elmyra Banwart, Cordelia Poche-C 12/01/17 1410    Little, Ambrose Finlandachel Morgan, MD 12/03/17 2300

## 2017-12-01 NOTE — ED Triage Notes (Signed)
Per spanish interpreter he has been having nausea X1 month without vomiting. Pt also reports diarrhea without pain. Pt currently denies abdominal pain.

## 2017-12-04 ENCOUNTER — Encounter: Payer: Self-pay | Admitting: Gastroenterology

## 2017-12-08 ENCOUNTER — Encounter: Payer: Self-pay | Admitting: Gastroenterology

## 2017-12-08 ENCOUNTER — Ambulatory Visit: Payer: Self-pay | Admitting: Gastroenterology

## 2017-12-08 VITALS — BP 110/70 | HR 86 | Ht 64.0 in | Wt 119.0 lb

## 2017-12-08 DIAGNOSIS — R112 Nausea with vomiting, unspecified: Secondary | ICD-10-CM

## 2017-12-08 NOTE — Progress Notes (Signed)
HPI: This is a very pleasant 27 year old man who was referred to me by Lorenda IshiharaVaradarajan, Rupashree,*  to evaluate recent GI upset.    Chief complaint is recent nausea, abdominal pain, diarrhea  He is here with a professional interpreter who helps with translation.  Nuances of the history as always are "lost in translation"  Nausea every day for a month.  Never vomited.  No weight changes.  Now he says the pain is gone, but he still has nausea.  No nsaids.  Then diarrhea started but only one day.  That same day he had some epigastric pain.  The diarrhea in the epigastric pain have both resolved.  Feels very bloated with small amounts of food.  No weight changes.  No fevers or chills.  Old Data Reviewed: Labs last week showed normal CBC, normal complete med about profile, normal urinalysis, normal lipase    Review of systems: Pertinent positive and negative review of systems were noted in the above HPI section. All other review negative.   Past Medical History:  Diagnosis Date  . Anxiety   . Migraines    sees neurologist    History reviewed. No pertinent surgical history.  Current Outpatient Medications  Medication Sig Dispense Refill  . nortriptyline (PAMELOR) 50 MG capsule Take one capsule at night 30 capsule 11   No current facility-administered medications for this visit.     Allergies as of 12/08/2017 - Review Complete 12/08/2017  Allergen Reaction Noted  . Phenergan [promethazine hcl] Anxiety and Palpitations 03/26/2014  . Caffeine Nausea And Vomiting 03/26/2014    Family History  Problem Relation Age of Onset  . Diabetes Mother   . High blood pressure Mother   . Prostate cancer Maternal Grandfather   . Cancer Maternal Grandmother     Social History   Socioeconomic History  . Marital status: Significant Other    Spouse name: Not on file  . Number of children: 1  . Years of education: Not on file  . Highest education level: Not on file  Occupational  History  . 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 Financial resource strain: Not on file  . Food insecurity:    Worry: Not on file    Inability: Not on file  . Transportation needs:    Medical: Not on file    Non-medical: Not on file  Tobacco Use  . Smoking status: Never Smoker  . Smokeless tobacco: Never Used  Substance and Sexual Activity  . Alcohol use: No    Alcohol/week: 0.0 standard drinks  . Drug use: Yes    Types: Marijuana  . Sexual activity: Not on file  Lifestyle  . Physical activity:    Days per week: Not on file    Minutes per session: Not on file  . Stress: Not on file  Relationships  . Social connections:    Talks on phone: Not on file    Gets together: Not on file    Attends religious service: Not on file    Active member of club or organization: Not on file    Attends meetings of clubs or organizations: Not on file    Relationship status: Not on file  . Intimate partner violence:    Fear of current or ex partner: Not on file    Emotionally abused: Not on file    Physically abused: Not on file    Forced sexual activity: Not on file  Other Topics Concern  . Not on file  Social  History Narrative   ** Merged History Encounter **         Physical Exam: BP 110/70   Pulse 86   Ht 5\' 4"  (1.626 m)   Wt 119 lb (54 kg)   BMI 20.43 kg/m  Constitutional: generally well-appearing Psychiatric: alert and oriented x3 Eyes: extraocular movements intact Mouth: oral pharynx moist, no lesions Neck: supple no lymphadenopathy Cardiovascular: heart regular rate and rhythm Lungs: clear to auscultation bilaterally Abdomen: soft, nontender, nondistended, no obvious ascites, no peritoneal signs, normal bowel sounds Extremities: no lower extremity edema bilaterally Skin: no lesions on visible extremities   Assessment and plan: 27 y.o. male with nausea  He has had nausea for about a month.  Also brief epigastric pain and diarrhea but those improved.  He does not take  NSAIDs.  He is going to try H2 blocker twice daily and I recommended we proceed with an upper endoscopy to evaluate for H. pylori, peptic ulcer disease, significant acid damage in his esophagus.  I see no reason for any further blood tests or imaging studies prior to then.  After I left the room he changed his mind about the upper endoscopy and currently declines.  He will try H2-blocker twice daily for now and he will call if he wants to proceed with the upper endoscopy.  Please see the "Patient Instructions" section for addition details about the plan.   Rob Bunting, MD [facility] 12/08/2017, 9:52 AM  Cc: Lorenda Ishihara,*

## 2017-12-08 NOTE — Patient Instructions (Addendum)
  Start ranitidine 150mg  pills (OTC) one pill at bedtime and one pill every AM.  Work note for today, written.  Normal BMI (Body Mass Index- based on height and weight) is between 19 and 25. Your BMI today is Body mass index is 20.43 kg/m. Marland Kitchen. Please consider follow up  regarding your BMI with your Primary Care Provider.

## 2017-12-11 ENCOUNTER — Telehealth: Payer: Self-pay | Admitting: Gastroenterology

## 2017-12-11 NOTE — Telephone Encounter (Signed)
Left message on machine to call back  

## 2017-12-12 NOTE — Telephone Encounter (Signed)
The pt has been scheduled for pr visit and EGD and was advised to try omeprazole OTC in the AM and ranitidine 150 mg OTC at bedtime.  He was given reflux precautions and advised that he can call in periodically to see if we have had any cancellations.

## 2017-12-27 ENCOUNTER — Ambulatory Visit (AMBULATORY_SURGERY_CENTER): Payer: Self-pay

## 2017-12-27 VITALS — Ht 61.0 in | Wt 123.4 lb

## 2017-12-27 DIAGNOSIS — R1013 Epigastric pain: Secondary | ICD-10-CM

## 2017-12-27 DIAGNOSIS — R110 Nausea: Secondary | ICD-10-CM

## 2017-12-27 NOTE — Progress Notes (Signed)
No egg or soy allergy known to patient  No issues with past sedation with any surgeries  or procedures, no intubation problems  No diet pills per patient No home 02 use per patient  No blood thinners per patient  Pt denies issues with constipation  No A fib or A flutter  EMMI video sent to pt's e mail - declined   

## 2018-01-09 ENCOUNTER — Ambulatory Visit (AMBULATORY_SURGERY_CENTER): Payer: Self-pay | Admitting: Gastroenterology

## 2018-01-09 ENCOUNTER — Encounter: Payer: Self-pay | Admitting: Gastroenterology

## 2018-01-09 VITALS — BP 115/68 | HR 77 | Temp 98.9°F | Resp 16 | Ht 64.0 in | Wt 119.0 lb

## 2018-01-09 DIAGNOSIS — K2950 Unspecified chronic gastritis without bleeding: Secondary | ICD-10-CM

## 2018-01-09 DIAGNOSIS — K2970 Gastritis, unspecified, without bleeding: Secondary | ICD-10-CM

## 2018-01-09 DIAGNOSIS — R1013 Epigastric pain: Secondary | ICD-10-CM

## 2018-01-09 MED ORDER — SODIUM CHLORIDE 0.9 % IV SOLN: 500.0000 mL | Freq: Once | INTRAVENOUS | Status: DC

## 2018-01-09 NOTE — Patient Instructions (Signed)
YOU HAD AN ENDOSCOPIC PROCEDURE TODAY AT THE [facility]:   Refer to the procedure report that was given to you for any specific questions about what was found during the examination.  If the procedure report does not answer your questions, please call your gastroenterologist to clarify.  If you requested that your care partner not be given the details of your procedure findings, then the procedure report has been included in a sealed envelope for you to review at your convenience later.  YOU SHOULD EXPECT: Some feelings of bloating in the abdomen. Passage of more gas than usual.  Walking can help get rid of the air that was put into your GI tract during the procedure and reduce the bloating. If you had a lower endoscopy (such as a colonoscopy or flexible sigmoidoscopy) you may notice spotting of blood in your stool or on the toilet paper.   Please Note:  You might notice some irritation and congestion in your nose or some drainage.  This is from the oxygen used during your procedure.  There is no need for concern and it should clear up in a day or so.  SYMPTOMS TO REPORT IMMEDIATELY:    Following upper endoscopy (EGD)  Vomiting of blood or coffee ground material  New chest pain or pain under the shoulder blades  Painful or persistently difficult swallowing  New shortness of breath  Fever of 100F or higher  Black, tarry-looking stools  For urgent or emergent issues, a gastroenterologist can be reached at any hour by calling [phone].   DIET:  We do recommend a small meal at first, but then you may proceed to your regular diet.  Drink plenty of fluids but you should avoid alcoholic beverages for 24 hours.  ACTIVITY:  You should plan to take it easy for the rest of today and you should NOT DRIVE or use heavy machinery until tomorrow (because of the sedation medicines used during the test).    FOLLOW UP: Our staff will call the number listed on your records the next  business day following your procedure to check on you and address any questions or concerns that you may have regarding the information given to you following your procedure. If we do not reach you, we will leave a message.  However, if you are feeling well and you are not experiencing any problems, there is no need to return our call.  We will assume that you have returned to your regular daily activities without incident.  If any biopsies were taken you will be contacted by phone or by letter within the next 1-3 weeks.  Please call us at [phone] if you have not heard about the biopsies in 3 weeks.    SIGNATURES/CONFIDENTIALITY: You and/or your care partner have signed paperwork which will be entered into your electronic medical record.  These signatures attest to the fact that that the information above on your After Visit Summary has been reviewed and is understood.  Full responsibility of the confidentiality of this discharge information lies with you and/or your care-partner.  Read all handouts given to you by your recovery room nurse.

## 2018-01-09 NOTE — Progress Notes (Signed)
Called to room to assist during endoscopic procedure.  Patient ID and intended procedure confirmed with present staff. Received instructions for my participation in the procedure from the performing physician.  

## 2018-01-09 NOTE — Op Note (Signed)
[facility] Patient Name: Howard Bender Procedure Date: 01/09/2018 3:34 PM MRN: [id number] Endoscopist: Rachael Fee , MD Age: 27 Referring MD:  Date of Birth: 01-02-1991 Gender: Male Account #: [id number] Procedure:                Upper GI endoscopy Indications:              Epigastric abdominal pain, Nausea Medicines:                Monitored Anesthesia Care Procedure:                Pre-Anesthesia Assessment:                           - Prior to the procedure, a History and Physical                            was performed, and patient medications and                            allergies were reviewed. The patient's tolerance of                            previous anesthesia was also reviewed. The risks                            and benefits of the procedure and the sedation                            options and risks were discussed with the patient.                            All questions were answered, and informed consent                            was obtained. Prior Anticoagulants: The patient has                            taken no previous anticoagulant or antiplatelet                            agents. ASA Grade Assessment: II - A patient with                            mild systemic disease. After reviewing the risks                            and benefits, the patient was deemed in                            satisfactory condition to undergo the procedure.                           After obtaining informed consent, the endoscope was  passed under direct vision. Throughout the                            procedure, the patient's blood pressure, pulse, and                            oxygen saturations were monitored continuously. The                            Endoscope was introduced through the mouth, and                            advanced to the second part of duodenum. The upper                            GI endoscopy was  accomplished without difficulty.                            The patient tolerated the procedure well. Scope In: Scope Out: Findings:                 Mild inflammation characterized by erythema and                            friability was found in the gastric antrum.                            Biopsies were taken with a cold forceps for                            histology.                           The exam was otherwise without abnormality. Complications:            No immediate complications. Estimated blood loss:                            None. Estimated Blood Loss:     Estimated blood loss: none. Impression:               - Gastritis. Biopsied.                           - The examination was otherwise normal. Recommendation:           - Patient has a contact number available for                            emergencies. The signs and symptoms of potential                            delayed complications were discussed with the                            patient. Return to normal activities tomorrow.  Written discharge instructions were provided to the                            patient.                           - Resume previous diet.                           - Continue present medications.                           - Await pathology results. Rachael Feeaniel P , MD 01/09/2018 3:46:37 PM This report has been signed electronically.

## 2018-01-09 NOTE — Progress Notes (Signed)
Spontaneous respirations throughout. VSS. Resting comfortably. To PACU on room air. Report to  RN. 

## 2018-01-09 NOTE — Progress Notes (Signed)
Pt's states no medical or surgical changes since previsit or office visit. 

## 2018-01-10 ENCOUNTER — Telehealth: Payer: Self-pay

## 2018-01-10 ENCOUNTER — Telehealth: Payer: Self-pay | Admitting: *Deleted

## 2018-01-10 NOTE — Telephone Encounter (Signed)
Attempted to reach patient for post-procedure f/u call. This is the 2nd call. No answer and unable to leave message as the voicemail box has not been set up yet.

## 2018-01-10 NOTE — Telephone Encounter (Signed)
  Follow up Call-  Call back number 01/09/2018  Post procedure Call Back phone  # [phone] friend Delia  Permission to leave phone message Yes  Some recent data might be hidden     Spoke with patient's friend Delia.  She had not spoken with him today but will try to reach him.  Advised her that we would call her again this afternoon.

## 2018-01-12 ENCOUNTER — Encounter: Payer: Self-pay | Admitting: Gastroenterology

## 2018-07-23 ENCOUNTER — Telehealth (INDEPENDENT_AMBULATORY_CARE_PROVIDER_SITE_OTHER): Payer: Self-pay | Admitting: Neurology

## 2018-07-23 ENCOUNTER — Other Ambulatory Visit: Payer: Self-pay

## 2018-07-23 NOTE — Progress Notes (Signed)
Patient was a no-show for e-visit.

## 2018-07-30 ENCOUNTER — Other Ambulatory Visit: Payer: Self-pay | Admitting: *Deleted

## 2018-07-30 ENCOUNTER — Telehealth: Payer: Self-pay | Admitting: Neurology

## 2018-07-30 DIAGNOSIS — G43009 Migraine without aura, not intractable, without status migrainosus: Secondary | ICD-10-CM

## 2018-07-30 MED ORDER — NORTRIPTYLINE HCL 50 MG PO CAPS: ORAL_CAPSULE | ORAL | 0 refills | Status: DC

## 2018-07-30 NOTE — Telephone Encounter (Signed)
Patient called and is at [facility] but does not have his medication refilled. He has been rescheduled for an E Visit. Please Call in Rx. Thanks

## 2018-07-30 NOTE — Telephone Encounter (Signed)
Patient called regarding needing a refill on his Nortriptyline medication. He uses [facility] [address]. He No Showed to his appointment on 07/23/2018. Please Call. Thanks

## 2018-07-30 NOTE — Telephone Encounter (Signed)
Ok for refills until f/u, thanks!

## 2018-07-30 NOTE — Telephone Encounter (Signed)
No show last week, no f/u scheduled.   pls advise about refill. Thanks!

## 2018-07-30 NOTE — Telephone Encounter (Signed)
Patient is at Pharmacy now to pick up medication. Thanks

## 2018-07-30 NOTE — Telephone Encounter (Signed)
Rx sent in for #30 and no refills.

## 2018-08-01 ENCOUNTER — Other Ambulatory Visit: Payer: Self-pay

## 2018-08-01 DIAGNOSIS — G43009 Migraine without aura, not intractable, without status migrainosus: Secondary | ICD-10-CM

## 2018-08-01 MED ORDER — NORTRIPTYLINE HCL 50 MG PO CAPS: ORAL_CAPSULE | ORAL | 0 refills | Status: DC

## 2018-08-07 ENCOUNTER — Other Ambulatory Visit: Payer: Self-pay

## 2018-08-07 ENCOUNTER — Telehealth (INDEPENDENT_AMBULATORY_CARE_PROVIDER_SITE_OTHER): Payer: Self-pay | Admitting: Neurology

## 2018-08-07 ENCOUNTER — Encounter: Payer: Self-pay | Admitting: Neurology

## 2018-08-07 DIAGNOSIS — G43009 Migraine without aura, not intractable, without status migrainosus: Secondary | ICD-10-CM

## 2018-08-07 MED ORDER — NORTRIPTYLINE HCL 50 MG PO CAPS: ORAL_CAPSULE | ORAL | 11 refills | Status: DC

## 2018-08-07 NOTE — Progress Notes (Signed)
Virtual Visit via Video Note The purpose of this virtual visit is to provide medical care while limiting exposure to the novel coronavirus.    Consent was obtained for video visit:  Yes.   Answered questions that patient had about telehealth interaction:  Yes.   I discussed the limitations, risks, security and privacy concerns of performing an evaluation and management service by telemedicine. I also discussed with the patient that there may be a patient responsible charge related to this service. The patient expressed understanding and agreed to proceed.  Pt location: Home Physician Location: office Name of referring provider:  Lorenda Ishihara,* I connected with Howard Bender at patients initiation/request on 08/07/2018 at  2:00 PM EDT by video enabled telemedicine application and verified that I am speaking with the correct person using two identifiers. Pt MRN:  [id number] Pt DOB:  1991/02/14 Video Participants:  Howard Bender;  girlfriend  History of Present Illness:  The patient was last seen a year ago for episodic migraine. His girlfriend is present during the e-visit and helps with Spanish translation. He continues to do well and denies any headaches on nortriptyline 50mg  qhs. Prior attempts to wean in the past caused headache recurrence. He denies any dizziness, vision changes, focal numbness/tingling/weakness, no falls. He denies any side effects on nortriptyline, sleep is good.    HPI: This is a pleasant 28 yo RH man with no significant past medical history in his usually state of health until the past year when he started having left temporal, bilateral retro-orbital and periorbital (above the eyelids) sharp pain with associated photosensitivity. He reports that pain usually occurs when he walks around, he feels better sitting down and after eating. He feels that symptoms started when he began using his cellphone, with his head bent forward. Extending his head back  seemed to help. He reported pain occurs daily, with rarely a day without pain. 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 When the pain first started, he was having associated nausea, which was relieved with Zofran. He has stopped taking this. He also started having dizziness described as a brief spinning sensation with head movement, resolving when head is still. He also had black floaters in his vision. He had seen an optometrist twice and was told vision was okay. He had seen ENT, evaluation unremarkable, and symptoms felt to be due to migraine. He took 2 tablets of Excedrin migraine one time, however had side effects of whole body numbness, leading to an visit to [facility] ER last 08/2013 where head CT done which I personally reviewed was unremarkable. He initially reported improvement in symptoms with nortriptyline 30mg  qhs. 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 There is no family history of headaches.  Observations/Objective:   Vitals:   08/07/18 1219  Weight: 121 lb 4.1 oz (55 kg)  Height: 5\' 4"  (1.626 m)   Patient is awake, alert, oriented x 3. No aphasia or dysarthria. Intact fluency and comprehension. Remote and recent memory intact. Able to name and repeat. Cranial nerves: pupils equal, round. Extraocular movements intact with no nystagmus. No facial asymmetry. Motor: moves all extremities symmetrically. No incoordination on finger to nose testing. Gait: narrow-based and steady, able to tandem walk adequately. Negative Romberg test.  Assessment and Plan:   This is a pleasant 28 yo RH man who presented with chronic daily headaches  in 2015. He has had good response to nortriptyline 50mg  qhs with no headaches in the past year. Prior attempts to wean caused migraine recurrence. Refills for nortriptyline 50mg  qhs were sent. Follow-up in 1 year, he knows to call for any changes.   Follow Up  Instructions:   -I discussed the assessment and treatment plan with the patient. The patient was provided an opportunity to ask questions and all were answered. The patient agreed with the plan and demonstrated an understanding of the instructions.   The patient was advised to call back or seek an in-person evaluation if the symptoms worsen or if the condition fails to improve as anticipated.    Van ClinesKaren M Kima Malenfant, MD

## 2019-07-08 ENCOUNTER — Encounter: Payer: Self-pay | Admitting: Neurology

## 2019-07-11 ENCOUNTER — Encounter: Payer: Self-pay | Admitting: Neurology

## 2019-07-23 ENCOUNTER — Emergency Department (HOSPITAL_COMMUNITY)
Admission: EM | Admit: 2019-07-23 | Discharge: 2019-07-24 | Disposition: A | Discharge status: Home / Self Care | Payer: Self-pay | Attending: Emergency Medicine | Admitting: Emergency Medicine

## 2019-07-23 ENCOUNTER — Encounter (HOSPITAL_COMMUNITY): Payer: Self-pay | Admitting: Emergency Medicine

## 2019-07-23 ENCOUNTER — Other Ambulatory Visit: Payer: Self-pay

## 2019-07-23 DIAGNOSIS — R590 Localized enlarged lymph nodes: Secondary | ICD-10-CM | POA: Insufficient documentation

## 2019-07-23 DIAGNOSIS — Z79899 Other long term (current) drug therapy: Secondary | ICD-10-CM | POA: Insufficient documentation

## 2019-07-23 DIAGNOSIS — B029 Zoster without complications: Secondary | ICD-10-CM | POA: Insufficient documentation

## 2019-07-23 NOTE — ED Triage Notes (Signed)
Pt c/o left side neck pain have some rash on his neck that is getting painful. No fever.

## 2019-07-24 MED ORDER — VALACYCLOVIR HCL 1 G PO TABS: 1000.0000 mg | ORAL_TABLET | Freq: Two times a day (BID) | ORAL | 0 refills | Status: DC

## 2019-07-24 MED ORDER — PREDNISONE 20 MG PO TABS
60.0000 mg | ORAL_TABLET | Freq: Once | ORAL | Status: AC
  Administered 2019-07-24: 03:00:00 60 mg via ORAL
  Filled 2019-07-24: qty 3

## 2019-07-24 MED ORDER — PREDNISONE 10 MG PO TABS: 20.0000 mg | ORAL_TABLET | Freq: Every day | ORAL | 0 refills | Status: DC

## 2019-07-24 MED ORDER — CEPHALEXIN 250 MG PO CAPS
500.0000 mg | ORAL_CAPSULE | Freq: Once | ORAL | Status: AC
  Administered 2019-07-24: 03:00:00 500 mg via ORAL
  Filled 2019-07-24: qty 2

## 2019-07-24 MED ORDER — CEPHALEXIN 500 MG PO CAPS
500.0000 mg | ORAL_CAPSULE | Freq: Three times a day (TID) | ORAL | 0 refills | Status: DC
Start: 2019-07-24 — End: 2019-07-29

## 2019-07-24 NOTE — Discharge Instructions (Signed)
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  Regrese si sus sntomas empeoran.

## 2019-07-24 NOTE — ED Provider Notes (Signed)
[facility] EMERGENCY DEPARTMENT Provider Note   CSN: [id number] Arrival date & time: 07/23/19  2243   History Chief Complaint  Patient presents with  . Neck Pain    Howard Bender is a 28 y.o. male.  The history is provided by the patient. A language interpreter was used.  Neck Pain He has no significant history and comes in with 2-day history of a painful area on the left side of his neck posteriorly, and a painful lump in the left supraclavicular area.  Symptoms have been stable.  He denies fever or chills.  He has not taken anything for pain.  He denies any unusual exposures.  Past Medical History:  Diagnosis Date  . Anxiety   . Hypertension one year   resolved  . Migraines    sees neurologist    Patient Active Problem List   Diagnosis Date Noted  . Dizziness 07/27/2015  . Chronic daily headache 07/08/2014  . Anxiety state 07/08/2014  . Migraine without aura and without status migrainosus, not intractable 01/27/2014  . Headache(784.0) 12/01/2013    History reviewed. No pertinent surgical history.     Family History  Problem Relation Age of Onset  . Diabetes Mother   . High blood pressure Mother   . Prostate cancer Maternal Grandfather   . Cancer Maternal Grandmother   . Colon polyps Neg Hx   . Colon cancer Neg Hx   . Esophageal cancer Neg Hx   . Rectal cancer Neg Hx   . Stomach cancer Neg Hx     Social History   Tobacco Use  . Smoking status: Never Smoker  . Smokeless tobacco: Never Used  Substance Use Topics  . Alcohol use: No    Alcohol/week: 0.0 standard drinks  . Drug use: Yes    Types: Marijuana    Home Medications Prior to Admission medications   Medication Sig Start Date End Date Taking? Authorizing Provider  ALPRAZolam Prudy Feeler) 0.5 MG tablet Take 1 tablet (0.5 mg total) by mouth 3 (three) times daily as needed for anxiety. 01/08/16   Elpidio Anis, PA-C  nortriptyline (PAMELOR) 50 MG capsule Take 50 mg by mouth at  bedtime.    [provider]  nortriptyline (PAMELOR) 50 MG capsule Take one capsule at night 08/07/18   Van Clines, MD  ondansetron (ZOFRAN ODT) 8 MG disintegrating tablet 8mg  ODT q4 hours prn nausea 07/07/16   07/09/16, MD    Allergies    Caffeine, Phenergan [promethazine hcl], and Caffeine  Review of Systems   Review of Systems  Musculoskeletal: Positive for neck pain.  All other systems reviewed and are negative.   Physical Exam Updated Vital Signs BP 133/77 (BP Location: Right Arm)   Pulse (!) 107   Temp 98.9 F (37.2 C) (Oral)   Resp 16   SpO2 99%   Physical Exam Vitals and nursing note reviewed.   29 year old male, resting comfortably and in no acute distress. Vital signs are significant for elevated blood pressure and borderline elevated heart rate. Oxygen saturation is 99%, which is normal. Head is normocephalic and atraumatic. PERRLA, EOMI. Oropharynx is clear. Neck: There is a vesicular rash in the posterior aspect of the left side of the neck.  There is a tender left supraclavicular lymph node which measures about 1 cm. Back is nontender and there is no CVA tenderness. Lungs are clear without rales, wheezes, or rhonchi. Chest is nontender. Heart has regular rate and rhythm without  murmur. Abdomen is soft, flat, nontender without masses or hepatosplenomegaly and peristalsis is normoactive. Extremities have no cyanosis or edema, full range of motion is present. Skin is warm and dry without rash. Neurologic: Mental status is normal, cranial nerves are intact, there are no motor or sensory deficits.   ED Results / Procedures / Treatments    Procedures Procedures   Medications Ordered in ED Medications  cephALEXin (KEFLEX) capsule 500 mg (500 mg Oral Given 07/24/19 0241)  predniSONE (DELTASONE) tablet 60 mg (60 mg Oral Given 07/24/19 0241)    ED Course  I have reviewed the triage vital signs and the nursing notes.  Vesicular rash which is most  likely herpes zoster.  Left supraclavicular lymphadenopathy, probably reactive.  He is advised use over-the-counter analgesics as needed for pain, given prescriptions for cephalexin, valacyclovir, prednisone.  Old records reviewed, and he has no relevant past visits.  MDM Rules/Calculators/A&P  Final Clinical Impression(s) / ED Diagnoses Final diagnoses:  Herpes zoster without complication  Lymphadenopathy, supraclavicular    Rx / DC Orders ED Discharge Orders         Ordered    valACYclovir (VALTREX) 1000 MG tablet  2 times daily     07/24/19 0232    predniSONE (DELTASONE) 10 MG tablet  Daily     07/24/19 0232    cephALEXin (KEFLEX) 500 MG capsule  3 times daily     07/24/19 6270           Delora Fuel, MD 35/00/93 [phone]

## 2019-07-24 NOTE — ED Notes (Addendum)
Patient verbalizes understanding of discharge instructions. Opportunity for questioning and answers were provided. Armband removed by staff, pt discharged from ED ambulatory. Discharge instructions reviewed with translator. Esignature pad not working

## 2019-07-25 ENCOUNTER — Ambulatory Visit: Payer: Self-pay | Admitting: Neurology

## 2019-07-29 ENCOUNTER — Ambulatory Visit: Payer: Self-pay | Admitting: Neurology

## 2019-07-29 ENCOUNTER — Other Ambulatory Visit: Payer: Self-pay

## 2019-07-29 ENCOUNTER — Encounter: Payer: Self-pay | Admitting: Neurology

## 2019-07-29 VITALS — BP 136/87 | HR 77 | Ht 64.0 in | Wt 144.2 lb

## 2019-07-29 DIAGNOSIS — G43009 Migraine without aura, not intractable, without status migrainosus: Secondary | ICD-10-CM

## 2019-07-29 MED ORDER — NORTRIPTYLINE HCL 50 MG PO CAPS: 50.0000 mg | ORAL_CAPSULE | Freq: Every day | ORAL | 3 refills | Status: DC

## 2019-07-29 NOTE — Patient Instructions (Addendum)
Great to see you! Continue nortriptyline 50mg  every night. Follow-up in 1 year, call for any changes  Qu bueno verte! Contine con nortriptilina 50 mg todas las noches. Seguimiento en 1 ao, llame para cambios

## 2019-07-29 NOTE — Progress Notes (Signed)
NEUROLOGY FOLLOW UP OFFICE NOTE  Howard Bender [id number] 06/03/1990  HISTORY OF PRESENT ILLNESS: I had the pleasure of seeing Howard Bender in follow-up in the neurology clinic on 07/29/2019.  The patient was last seen a year ago for episodic migraine. A Spanish medical interpreter is present during the visit. He continues to do well over the past year, he denies any headaches on nortriptyline 50mg  qhs, no side effects. Prior attempts in the past to wean medication caused headache recurrence. He denies any dizziness, vision changes, focal numbness/tingling/weakness, no falls. Mood and sleep are good.   HPI: This is a pleasant 29 yo RH man with no significant past medical history in his usually state of health until the past year when he started having left temporal, bilateral retro-orbital and periorbital (above the eyelids) sharp pain with associated photosensitivity. He reports that pain usually occurs when he walks around, he feels better sitting down and after eating. He feels that symptoms started when he began using his cellphone, with his head bent forward. Extending his head back seemed to help. He reported pain occurs daily, with rarely a day without pain. He stated pain is a constant 2 to 3 over 10, increasing up to 10/10, however he can still continue working as a 28. When the pain first started, he was having associated nausea, which was relieved with Zofran. He has stopped taking this. He also started having dizziness described as a brief spinning sensation with head movement, resolving when head is still. He also had black floaters in his vision. He had seen an optometrist twice and was told vision was okay. He had seen ENT, evaluation unremarkable, and symptoms felt to be due to migraine. He took 2 tablets of Excedrin migraine one time, however had side effects of whole body numbness, leading to an visit to [facility] ER last 08/2013 where head CT done which I personally reviewed was  unremarkable. He initially reported improvement in symptoms with nortriptyline 28mg  qhs. 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 There is no family history of headaches.   PAST MEDICAL HISTORY: Past Medical History:  Diagnosis Date  . Anxiety   . Hypertension one year   resolved  . Migraines    sees neurologist    MEDICATIONS: Current Outpatient Medications on File Prior to Visit  Medication Sig Dispense Refill  . nortriptyline (PAMELOR) 50 MG capsule Take 50 mg by mouth at bedtime.     No current facility-administered medications on file prior to visit.    ALLERGIES: Allergies  Allergen Reactions  . Caffeine Shortness Of Breath and Other (See Comments)    RAPID HEART RATE  . Phenergan [Promethazine Hcl] Anxiety and Palpitations  . Caffeine Nausea And Vomiting    FAMILY HISTORY: Family History  Problem Relation Age of Onset  . Diabetes Mother   . High blood pressure Mother   . Prostate cancer Maternal Grandfather   . Cancer Maternal Grandmother   . Colon polyps Neg Hx   . Colon cancer Neg Hx   . Esophageal cancer Neg Hx   . Rectal cancer Neg Hx   . Stomach cancer Neg Hx     SOCIAL HISTORY: Social History   Socioeconomic History  . Marital status: Significant Other    Spouse name: Not on file  . Number of children: 1  . Years of education: Not on file  . Highest education level: Not on file  Occupational History  .  Occupation: cook  Tobacco Use  . Smoking status: Never Smoker  . Smokeless tobacco: Never Used  Substance and Sexual Activity  . Alcohol use: No    Alcohol/week: 0.0 standard drinks  . Drug use: Not Currently    Types: Marijuana  . Sexual activity: Not on file  Other Topics Concern  . Not on file  Social History Narrative   ** Merged History Encounter **       ** Data from: 08/07/18 Enc Dept: [facility]   ** Merged History Encounter **           ** Data from: 07/07/16 Enc Dept: [place]  DEPT   ** Merged History Encounter **       Social Determinants of Health   Financial Resource Strain:   . Difficulty of Paying Living Expenses:   Food Insecurity:   . Worried About Charity fundraiser in the Last Year:   . Arboriculturist in the Last Year:   Transportation Needs:   . Film/video editor (Medical):   Marland Kitchen Lack of Transportation (Non-Medical):   Physical Activity:   . Days of Exercise per Week:   . Minutes of Exercise per Session:   Stress:   . Feeling of Stress :   Social Connections:   . Frequency of Communication with Friends and Family:   . Frequency of Social Gatherings with Friends and Family:   . Attends Religious Services:   . Active Member of Clubs or Organizations:   . Attends Archivist Meetings:   Marland Kitchen Marital Status:   Intimate Partner Violence:   . Fear of Current or Ex-Partner:   . Emotionally Abused:   Marland Kitchen Physically Abused:   . Sexually Abused:     REVIEW OF SYSTEMS: Constitutional: No fevers, chills, or sweats, no generalized fatigue, change in appetite Eyes: No visual changes, double vision, eye pain Ear, nose and throat: No hearing loss, ear pain, nasal congestion, sore throat Cardiovascular: No chest pain, palpitations Respiratory:  No shortness of breath at rest or with exertion, wheezes GastrointestinaI: No nausea, vomiting, diarrhea, abdominal pain, fecal incontinence Genitourinary:  No dysuria, urinary retention or frequency Musculoskeletal:  No neck pain, back pain Integumentary: No rash, pruritus, skin lesions Neurological: as above Psychiatric: No depression, insomnia, anxiety Endocrine: No palpitations, fatigue, diaphoresis, mood swings, change in appetite, change in weight, increased thirst Hematologic/Lymphatic:  No anemia, purpura, petechiae. Allergic/Immunologic: no itchy/runny eyes, nasal congestion, recent allergic reactions, rashes  PHYSICAL EXAM: Vitals:   07/29/19 1013  BP: 136/87  Pulse: 77  SpO2: 98%    General: No acute distress Head:  Normocephalic/atraumatic Skin/Extremities: No rash, no edema Neurological Exam: alert and oriented to person, place, and time. No aphasia or dysarthria. Fund of knowledge is appropriate.  Recent and remote memory are intact.  Attention and concentration are normal. Cranial nerves: Pupils equal, round, reactive to light.  Extraocular movements intact with no nystagmus. Visual fields full. No facial asymmetry. Motor: Bulk and tone normal, muscle strength 5/5 throughout with no pronator drift.  Finger to nose testing intact.  Gait narrow-based and steady, able to tandem walk adequately.    IMPRESSION: This is a pleasant 29 yo RH man who presented with chronic daily headaches in 2015. He has had good response to nortriptyline 50mg  qhs, prior attempts to wean had caused headache recurrence, he would like to stay on medication. No side effects. Refills for a year sent. Follow-up in 1 year, he knows to call for  any changes.   Thank you for allowing me to participate in his care.  Please do not hesitate to call for any questions or concerns.   Patrcia Dolly, M.D.   CC: Dr. Chales Salmon

## 2019-07-31 ENCOUNTER — Ambulatory Visit: Payer: Self-pay | Admitting: Neurology

## 2019-08-07 ENCOUNTER — Ambulatory Visit: Payer: Self-pay | Admitting: Neurology

## 2020-05-27 ENCOUNTER — Other Ambulatory Visit: Payer: Self-pay | Admitting: Neurology

## 2020-06-26 ENCOUNTER — Other Ambulatory Visit: Payer: Self-pay

## 2020-06-26 ENCOUNTER — Encounter (HOSPITAL_COMMUNITY): Payer: Self-pay

## 2020-06-26 ENCOUNTER — Emergency Department (HOSPITAL_COMMUNITY)
Admission: EM | Admit: 2020-06-26 | Discharge: 2020-06-26 | Disposition: A | Discharge status: Home / Self Care | Payer: Self-pay | Attending: Emergency Medicine | Admitting: Emergency Medicine

## 2020-06-26 DIAGNOSIS — I10 Essential (primary) hypertension: Secondary | ICD-10-CM | POA: Insufficient documentation

## 2020-06-26 DIAGNOSIS — Y990 Civilian activity done for income or pay: Secondary | ICD-10-CM | POA: Insufficient documentation

## 2020-06-26 DIAGNOSIS — S0501XA Injury of conjunctiva and corneal abrasion without foreign body, right eye, initial encounter: Secondary | ICD-10-CM | POA: Insufficient documentation

## 2020-06-26 DIAGNOSIS — W228XXA Striking against or struck by other objects, initial encounter: Secondary | ICD-10-CM | POA: Insufficient documentation

## 2020-06-26 MED ORDER — TETRACAINE HCL 0.5 % OP SOLN
2.0000 [drp] | Freq: Once | OPHTHALMIC | Status: AC
  Administered 2020-06-26: 2 [drp] via OPHTHALMIC
  Filled 2020-06-26: qty 4

## 2020-06-26 MED ORDER — ERYTHROMYCIN 5 MG/GM OP OINT
1.0000 "application " | TOPICAL_OINTMENT | Freq: Once | OPHTHALMIC | Status: AC
  Administered 2020-06-26: 1 via OPHTHALMIC
  Filled 2020-06-26: qty 3.5

## 2020-06-26 MED ORDER — FLUORESCEIN SODIUM 1 MG OP STRP
1.0000 | ORAL_STRIP | Freq: Once | OPHTHALMIC | Status: AC
  Administered 2020-06-26: 1 via OPHTHALMIC
  Filled 2020-06-26: qty 1

## 2020-06-26 MED ORDER — HYPROMELLOSE (GONIOSCOPIC) 2.5 % OP SOLN: 1.0000 [drp] | Freq: Four times a day (QID) | OPHTHALMIC | 0 refills | Status: DC | PRN

## 2020-06-26 NOTE — ED Triage Notes (Signed)
Spanish interpreter used for triage: Pt reports stabbing his right eye with his finger by accident yesterday. Pt having blurred vision and burning pain in that eye, redness noted to sclera, no significant swelling noted.

## 2020-06-26 NOTE — Discharge Instructions (Addendum)
Tiene una abrasin en la crnea, use la pomada de eritromicina que le dieron hoy, aplique media pulgada en el prpado inferior 4 veces al da durante 5 a 7 das. Tambin puede usar gotas lubricantes recetadas para ayudar a [employer], le recomiendo Risk analyst. Evite tocarse o frotarse los ojos y asegrese de [employer] manos antes de aplicar el ungento para los ojos. Si los sntomas no mejoran, haga un seguimiento con oftalmologa. Regrese si presenta fiebre, dolor significativo o hinchazn alrededor del ojo, cambios en la visin o cualquier otro sntoma nuevo o preocupante.  ---------------------------  You have a corneal abrasion, please use the erythromycin ointment that was given to you today, apply half-inch to the lower eyelid 4 times daily for 5 to 7 days.  You can also use prescribed lubricating eyedrops to help relieve discomfort, I recommend putting these in the refrigerator.  Avoid touching or rubbing the eye, and make sure you wash your hands before applying eye ointment.  If symptoms are not improving please follow-up with ophthalmology.  Return if you develop fevers, significant pain or swelling surrounding the eye, changes in vision or any other new or concerning symptoms.

## 2020-06-26 NOTE — ED Provider Notes (Signed)
[facility] EMERGENCY DEPARTMENT Provider Note   CSN: [id number] Arrival date & time: 06/26/20  0932     History Chief Complaint  Patient presents with  . Eye Injury    Howard Bender is a 30 y.o. male.  Howard Bender is a 30 y.o. male with a history of migraines, hypertension and anxiety, presents to the ED for evaluation of eye injury.  He reports yesterday while working he accidentally poked himself in the eye with his hand.  Reports since then he has developed blurred vision, frequent tearing of the eye and burning pain.  He reports that the eye looks quite red this morning.  He reports it will not stop watering and that is what is making his vision blurry.  He reports he did not have anything on his hands but feels like there is something in his eye.  Does not wear contacts or glasses.  No other aggravating or alleviating factors.  Spanish interpreter used to obtain history        Past Medical History:  Diagnosis Date  . Anxiety   . Hypertension one year   resolved  . Migraines    sees neurologist    Patient Active Problem List   Diagnosis Date Noted  . Dizziness 07/27/2015  . Chronic daily headache 07/08/2014  . Anxiety state 07/08/2014  . Migraine without aura and without status migrainosus, not intractable 01/27/2014  . Headache(784.0) 12/01/2013    History reviewed. No pertinent surgical history.     Family History  Problem Relation Age of Onset  . Diabetes Mother   . High blood pressure Mother   . Prostate cancer Maternal Grandfather   . Cancer Maternal Grandmother   . Colon polyps Neg Hx   . Colon cancer Neg Hx   . Esophageal cancer Neg Hx   . Rectal cancer Neg Hx   . Stomach cancer Neg Hx     Social History   Tobacco Use  . Smoking status: Never Smoker  . Smokeless tobacco: Never Used  Substance Use Topics  . Alcohol use: No    Alcohol/week: 0.0 standard drinks  . Drug use: Not Currently    Types: Marijuana     Home Medications Prior to Admission medications   Medication Sig Start Date End Date Taking? Authorizing Provider  nortriptyline (PAMELOR) 50 MG capsule Take 1 capsule by mouth at bedtime 05/27/20   Van Clines, MD    Allergies    Caffeine, Phenergan [promethazine hcl], and Caffeine  Review of Systems   Review of Systems  Constitutional: Negative for chills and fever.  Eyes: Positive for photophobia, pain and redness. Negative for discharge and visual disturbance.  Skin: Negative for wound.    Physical Exam Updated Vital Signs BP (!) 139/93   Pulse 94   Temp 98.6 F (37 C)   Resp 18   SpO2 98%   Physical Exam Vitals and nursing note reviewed.  Constitutional:      General: He is not in acute distress.    Appearance: Normal appearance. He is well-developed and normal weight. He is not ill-appearing or diaphoretic.  HENT:     Head: Normocephalic and atraumatic.  Eyes:     General:        Right eye: No discharge.        Left eye: No discharge.     Comments: Right eye with scleral injection, frequent watering and clear drainage, no purulence, no periorbital swelling or tenderness, no proptosis, EOMI,  PERRLA, on fluorescein stain patient has a very small corneal abrasion, no signs of ulceration.  No noted foreign bodies within the eye or eyelid.  Eye pressure of 20 mmHg Left eye is unremarkable  Pulmonary:     Effort: Pulmonary effort is normal. No respiratory distress.  Musculoskeletal:        General: No deformity.  Skin:    General: Skin is warm and dry.  Neurological:     Mental Status: He is alert and oriented to person, place, and time.     Coordination: Coordination normal.  Psychiatric:        Mood and Affect: Mood normal.        Behavior: Behavior normal.     ED Results / Procedures / Treatments   Labs (all labs ordered are listed, but only abnormal results are displayed) Labs Reviewed - No data to display  EKG None  Radiology No results  found.  Procedures Procedures   Medications Ordered in ED Medications  tetracaine (PONTOCAINE) 0.5 % ophthalmic solution 2 drop (has no administration in time range)  fluorescein ophthalmic strip 1 strip (has no administration in time range)  erythromycin ophthalmic ointment 1 application (has no administration in time range)    ED Course  I have reviewed the triage vital signs and the nursing notes.  Pertinent labs & imaging results that were available during my care of the patient were reviewed by me and considered in my medical decision making (see chart for details).    MDM Rules/Calculators/A&P                          Corneal abrasion  Pt with corneal abrasion on PE. No evidence of FB.  No change in vision, acuity equal bilaterally.  Pt is not a contact lens wearer.  Exam non-concerning for orbital cellulitis, hyphema, corneal ulcers. Patient will be discharged home with erythromycin.   Patient understands to follow up with ophthalmology, & to return to ER if new symptoms develop including change in vision, purulent drainage, or entrapment.   Final Clinical Impression(s) / ED Diagnoses Final diagnoses:  Abrasion of right cornea, initial encounter    Rx / DC Orders ED Discharge Orders         Ordered    hydroxypropyl methylcellulose / hypromellose (ISOPTO TEARS / GONIOVISC) 2.5 % ophthalmic solution  4 times daily PRN        06/26/20 1106           Dartha Lodge, [place] 06/26/20 1107    Cheryll Cockayne, MD 06/28/20 1123

## 2020-07-27 ENCOUNTER — Encounter: Payer: Self-pay | Admitting: Neurology

## 2020-07-27 ENCOUNTER — Ambulatory Visit (INDEPENDENT_AMBULATORY_CARE_PROVIDER_SITE_OTHER): Payer: Self-pay | Admitting: Neurology

## 2020-07-27 ENCOUNTER — Other Ambulatory Visit: Payer: Self-pay

## 2020-07-27 VITALS — BP 131/84 | HR 89 | Ht 64.0 in | Wt 143.3 lb

## 2020-07-27 DIAGNOSIS — G43009 Migraine without aura, not intractable, without status migrainosus: Secondary | ICD-10-CM

## 2020-07-27 MED ORDER — NORTRIPTYLINE HCL 50 MG PO CAPS: 50.0000 mg | ORAL_CAPSULE | Freq: Every day | ORAL | 3 refills | Status: DC

## 2020-07-27 NOTE — Progress Notes (Signed)
NEUROLOGY FOLLOW UP OFFICE NOTE  Howard Bender [id number] 1991-01-01  HISTORY OF PRESENT ILLNESS: I had the pleasure of seeing Howard Bender in follow-up in the neurology clinic on 07/27/2020.  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 A Spanish medical interpreter is present to help with translation. Since his last visit, he reports doing well, no headaches on nortriptyline 50mg  qhs, no side effects. Attempts to wean medication in the past caused headache recurrence. He denies any dizziness,  focal numbness/tingling/weakness, no falls. He had an eye injury at work last month with small corneal abrasion seen in the ER, he has recovered from this. Mood and sleep are good. He reports a painless little lump in the inside of his right cheek that appeared 4 years ago and has been growing.   HPI: This is a pleasant 30 yo RH man with no significant past medical history in his usually state of health until the past year when he started having left temporal, bilateral retro-orbital and periorbital (above the eyelids) sharp pain with associated photosensitivity. He reports that pain usually occurs when he walks around, he feels better sitting down and after eating. He feels that symptoms started when he began using his cellphone, with his head bent forward. Extending his head back seemed to help. He reported pain occurs daily, with rarely a day without pain. He stated pain is a constant 2 to 3 over 10, increasing up to 10/10, however he can still continue working as a 26. When the pain first started, he was having associated nausea, which was relieved with Zofran. He has stopped taking this. He also started having dizziness described as a brief spinning sensation with head movement, resolving when head is still. He also had black floaters in his vision. He had seen an optometrist twice and was told vision was okay. He had seen ENT, evaluation unremarkable, and symptoms felt  to be due to migraine. He took 2 tablets of Excedrin migraine one time, however had side effects of whole body numbness, leading to an visit to [facility] ER last 08/2013 where head CT done which I personally reviewed was unremarkable. He initially reported improvement in symptoms with nortriptyline 30mg  qhs. 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 There is no family history of headaches.  PAST MEDICAL HISTORY: Past Medical History:  Diagnosis Date  . Anxiety   . Hypertension one year   resolved  . Migraines    sees neurologist    MEDICATIONS: Current Outpatient Medications on File Prior to Visit  Medication Sig Dispense Refill  . nortriptyline (PAMELOR) 50 MG capsule Take 1 capsule by mouth at bedtime 60 capsule 0  . hydroxypropyl methylcellulose / hypromellose (ISOPTO TEARS / GONIOVISC) 2.5 % ophthalmic solution Place 1 drop into the right eye 4 (four) times daily as needed for dry eyes. (Patient not taking: Reported on 07/27/2020) 15 mL 0   No current facility-administered medications on file prior to visit.    ALLERGIES: Allergies  Allergen Reactions  . Caffeine Shortness Of Breath and Other (See Comments)    RAPID HEART RATE  . Phenergan [Promethazine Hcl] Anxiety and Palpitations  . Caffeine Nausea And Vomiting    FAMILY HISTORY: Family History  Problem Relation Age of Onset  . Diabetes Mother   . High blood pressure Mother   . Prostate cancer Maternal Grandfather   . Cancer Maternal Grandmother   . Colon polyps Neg  Hx   . Colon cancer Neg Hx   . Esophageal cancer Neg Hx   . Rectal cancer Neg Hx   . Stomach cancer Neg Hx     SOCIAL HISTORY: Social History   Socioeconomic History  . Marital status: Significant Other    Spouse name: Not on file  . Number of children: 1  . Years of education: Not on file  . Highest education level: Not on file  Occupational History  . Occupation: cook  Tobacco Use  . Smoking status: Never Smoker   . Smokeless tobacco: Never Used  Substance and Sexual Activity  . Alcohol use: Yes    Alcohol/week: 0.0 standard drinks  . Drug use: Not Currently    Types: Marijuana  . Sexual activity: Not on file  Other Topics Concern  . Not on file  Social History Narrative   ** Merged History Encounter **       ** Data from: 08/07/18 Enc Dept: [facility]   ** Merged History Encounter **           ** Data from: 07/07/16 Enc Dept: [facility]   ** Merged History Encounter **      Right handed       Social Determinants of Health   Financial Resource Strain: Not on file  Food Insecurity: Not on file  Transportation Needs: Not on file  Physical Activity: Not on file  Stress: Not on file  Social Connections: Not on file  Intimate Partner Violence: Not on file     PHYSICAL EXAM: Vitals:   07/27/20 1028  BP: 131/84  Pulse: 89  SpO2: 97%   General: No acute distress Head:  Normocephalic/atraumatic. There is a mobile large mass in the buccal mucosa of the right cheek Skin/Extremities: No rash, no edema Neurological Exam: alert and awake. No aphasia or dysarthria. Fund of knowledge is appropriate.  Recent and remote memory are intact.  Attention and concentration are normal.   Cranial nerves: Pupils equal, round. Extraocular movements intact with no nystagmus. Visual fields full.  No facial asymmetry.  Motor: Bulk and tone normal, muscle strength 5/5 throughout with no pronator drift.   Finger to nose testing intact.  Gait narrow-based and steady, able to tandem walk adequately.  Romberg negative.   IMPRESSION: This is a pleasant 30 yo RH man who presented with chronic daily headaches in 2015. He has had good response to notriptyline 50mg  qhs, attempts to wean off medication in the past caused headache recurrence. We again discussed option to wean off, but he would like to stay on medication due to prior history of recurrence, refills sent. He reports a mass in the right buccal  mucosa, advised to speak to PCP about ENT referral. Follow-up in 1 year, call for any changes.   Thank you for allowing me to participate in his care.  Please do not hesitate to call for any questions or concerns.   , M.D.

## 2020-07-27 NOTE — Patient Instructions (Signed)
1. Continue nortriptyline 50mg  every night  2. Discuss mouth changes with your PCP for ENT referral  3. Follow-up in 1 year, call for any changes

## 2021-02-26 ENCOUNTER — Encounter: Payer: Self-pay | Admitting: Neurology

## 2021-07-28 ENCOUNTER — Ambulatory Visit: Payer: Self-pay | Admitting: Neurology

## 2021-08-11 ENCOUNTER — Encounter: Payer: Self-pay | Admitting: Neurology

## 2021-08-11 ENCOUNTER — Ambulatory Visit (INDEPENDENT_AMBULATORY_CARE_PROVIDER_SITE_OTHER): Payer: Self-pay | Admitting: Neurology

## 2021-08-11 VITALS — BP 143/78 | HR 102 | Ht 65.0 in | Wt 144.2 lb

## 2021-08-11 DIAGNOSIS — G43009 Migraine without aura, not intractable, without status migrainosus: Secondary | ICD-10-CM

## 2021-08-11 MED ORDER — NORTRIPTYLINE HCL 50 MG PO CAPS: 50.0000 mg | ORAL_CAPSULE | Freq: Every day | ORAL | 3 refills | Status: DC

## 2021-08-11 NOTE — Progress Notes (Signed)
? ?NEUROLOGY FOLLOW UP OFFICE NOTE ? ?Howard Bender ?[id number] ?04/08/1991 ? ?HISTORY OF PRESENT ILLNESS: ?I had the pleasure of seeing Howard Bender in follow-up in the neurology clinic on 08/11/2021.  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 He is again accompanied by his friend who helps supplement the history today. A Spanish medical interpreter Howard Bender helps with translation. Records and images were personally reviewed where available. He reports doing well with his headaches on nortriptyline 50mg  qhs. When he misses 2-3 days of medication, he starts having the dizziness and nausea again. When he takes the medication regularly, he denies any headaches, dizziness, vision changes, focal numbness/tingling/weakness. No falls. Sleep is good. No side effects on nortriptyline. BP today 143/78.  ? ?HPI:  This is a pleasant 31 yo RH man with no significant past medical history in his usually state of health until the past year when he started having left temporal, bilateral retro-orbital and periorbital (above the eyelids) sharp pain with associated photosensitivity. He reports that pain usually occurs when he walks around, he feels better sitting down and after eating. He feels that symptoms started when he began using his cellphone, with his head bent forward. Extending his head back seemed to help. He reported pain occurs daily, with rarely a day without pain. 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 When the pain first started, he was having associated nausea, which was relieved with Zofran. He has stopped taking this. He also started having dizziness described as a brief spinning sensation with head movement, resolving when head is still. He also had black floaters in his vision. He had seen an optometrist twice and was told vision was okay. He had seen ENT, evaluation unremarkable, and symptoms felt  to be due to migraine. He took 2 tablets of Excedrin migraine one time, however had side effects of whole body numbness, leading to an visit to [facility] ER last 08/2013 where head CT done which I personally reviewed was unremarkable. He initially reported improvement in symptoms with nortriptyline 30mg  qhs. 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 There is no family history of headaches. ? ? ?PAST MEDICAL HISTORY: ?Past Medical History:  ?Diagnosis Date  ? Anxiety   ? Hypertension one year  ? resolved  ? Migraines   ? sees neurologist  ? ? ?MEDICATIONS: ?Current Outpatient Medications on File Prior to Visit  ?Medication Sig Dispense Refill  ? nortriptyline (PAMELOR) 50 MG capsule Take 1 capsule (50 mg total) by mouth at bedtime. 90 capsule 3  ? ?No current facility-administered medications on file prior to visit.  ? ? ?ALLERGIES: ?Allergies  ?Allergen Reactions  ? Caffeine Shortness Of Breath and Other (See Comments)  ?  RAPID HEART RATE  ? Phenergan [Promethazine Hcl] Anxiety and Palpitations  ? Caffeine Nausea And Vomiting  ? ? ?FAMILY HISTORY: ?Family History  ?Problem Relation Age of Onset  ? Diabetes Mother   ? High blood pressure Mother   ? Prostate cancer Maternal Grandfather   ? Cancer Maternal Grandmother   ? Colon polyps Neg Hx   ? Colon cancer Neg Hx   ? Esophageal cancer Neg Hx   ? Rectal cancer Neg Hx   ? Stomach cancer Neg Hx   ? ? ?SOCIAL HISTORY: ?Social History  ? ?Socioeconomic History  ? Marital status: Significant  Other  ?  Spouse name: Not on file  ? Number of children: 1  ? Years of education: Not on file  ? Highest education level: Not on file  ?Occupational History  ? Occupation: cook  ?Tobacco Use  ? Smoking status: Never  ? Smokeless tobacco: Never  ?Vaping Use  ? Vaping Use: Never used  ?Substance and Sexual Activity  ? Alcohol use: Yes  ?  Alcohol/week: 0.0 standard drinks  ? Drug use: Not Currently  ?  Types: Marijuana  ? Sexual activity: Not on file   ?Other Topics Concern  ? Not on file  ?Social History Narrative  ? ** Merged History Encounter **  ?    ? ** Data from: 08/07/18 Enc Dept: [facility] [place]  ? ** Merged History Encounter **  ?    ?    ? ** Data from: 07/07/16 Enc Dept: [facility]  ? ** Merged History Encounter **  ?   ? Right handed  ?    ? ?Social Determinants of Health  ? ?Financial Resource Strain: Not on file  ?Food Insecurity: Not on file  ?Transportation Needs: Not on file  ?Physical Activity: Not on file  ?Stress: Not on file  ?Social Connections: Not on file  ?Intimate Partner Violence: Not on file  ? ? ? ?PHYSICAL EXAM: ?Vitals:  ? 08/11/21 1008  ?BP: (!) 143/78  ?Pulse: (!) 102  ?SpO2: 99%  ? ?General: No acute distress ?Head:  Normocephalic/atraumatic ?Skin/Extremities: No rash, no edema ?Neurological Exam: alert and awake. No aphasia or dysarthria. Fund of knowledge is appropriate.Attention and concentration are normal.   Cranial nerves: Pupils equal, round. Extraocular movements intact with no nystagmus. Visual fields full.  No facial asymmetry.  Motor: Bulk and tone normal, muscle strength 5/5 throughout with no pronator drift.   Finger to nose testing intact.  Gait narrow-based and steady, able to tandem walk adequately.  Romberg negative. ? ? ?IMPRESSION: ?This is a pleasant 31 yo RH man who presented with chronic daily headaches in 2015. He has had good response to notriptyline 50mg  qhs, when he misses doses, headaches/nausea/dizziness recur. Continue nortriptyline 50mg  qhs, refills sent. BP today elevated, follow-up with PCP. Follow-up in 1 year, call for any changes.  ? ? ? ?Thank you for allowing me to participate in his care.  Please do not hesitate to call for any questions or concerns. ? ? ? ?Ellouise Newer, M.D. ? ? ?CC: Dr. Fara Olden ? ? ?

## 2021-08-11 NOTE — Patient Instructions (Addendum)
Que bueno verte. Contin?e nortriptilina 50 mg cada noche. Seguimiento en 1 a?o, llamar para cualquier cambio. ? ?Good to see you. Continue nortriptyline 50mg  every night. Follow-up in 1 year, call for any changes. ?

## 2022-08-17 ENCOUNTER — Ambulatory Visit (INDEPENDENT_AMBULATORY_CARE_PROVIDER_SITE_OTHER): Payer: Self-pay | Admitting: Neurology

## 2022-08-17 ENCOUNTER — Encounter: Payer: Self-pay | Admitting: Neurology

## 2022-08-17 VITALS — BP 144/80 | HR 97 | Wt 150.0 lb

## 2022-08-17 DIAGNOSIS — G43009 Migraine without aura, not intractable, without status migrainosus: Secondary | ICD-10-CM

## 2022-08-17 MED ORDER — NORTRIPTYLINE HCL 50 MG PO CAPS: 50.0000 mg | ORAL_CAPSULE | Freq: Every day | ORAL | 3 refills | Status: DC

## 2022-08-17 NOTE — Patient Instructions (Addendum)
Que bueno verte. Contine nortriptilina 50 mg todas las noches. Contine controlando la presin arterial con regularidad. Seguimiento en 1 ao, llamar para cualquier cambio.  Good to see you. Continue nortriptyline 50mg  every night. Continue to check BP regularly. Follow-up in 1 year, call for any changes.

## 2022-08-17 NOTE — Progress Notes (Signed)
NEUROLOGY FOLLOW UP OFFICE NOTE  Howard Bender [id number] [birth year]  HISTORY OF PRESENT ILLNESS: I had the pleasure of seeing Howard Bender in follow-up in the neurology clinic on 08/17/2022.  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 He is again accompanied by his friend who helps supplement the history today.  A Spanish medical interpreter Howard Bender helps with translation. Records and images were personally reviewed where available.  Since his last visit, he continues to do well headache-free on nortriptyline 50mg  qhs. He denies any dizziness, vision changes, focal numbness/tingling/weakness, no falls. Sleep is good. No side effects on medication. Attempts to wean medication in the past caused headache/dizziness recurrence. BP today 144/80. A year ago it was 143.78. He states it is normal when he checks it outside of a doctor's office.    HPI:  This is a pleasant 32 yo RH man with no significant past medical history in his usually state of health until the past year when he started having left temporal, bilateral retro-orbital and periorbital (above the eyelids) sharp pain with associated photosensitivity. He reports that pain usually occurs when he walks around, he feels better sitting down and after eating. He feels that symptoms started when he began using his cellphone, with his head bent forward. Extending his head back seemed to help. He reported pain occurs daily, with rarely a day without pain. 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 When the pain first started, he was having associated nausea, which was relieved with Zofran. He has stopped taking this. He also started having dizziness described as a brief spinning sensation with head movement, resolving when head is still. He also had black floaters in his vision. He had seen an optometrist twice and was told vision was okay. He had  seen ENT, evaluation unremarkable, and symptoms felt to be due to migraine. He took 2 tablets of Excedrin migraine one time, however had side effects of whole body numbness, leading to an visit to [facility] ER last 08/2013 where head CT done which I personally reviewed was unremarkable. He initially reported improvement in symptoms with nortriptyline 30mg  qhs. 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 There is no family history of headaches.   PAST MEDICAL HISTORY: Past Medical History:  Diagnosis Date   Anxiety    Hypertension one year   resolved   Migraines    sees neurologist    MEDICATIONS: Current Outpatient Medications on File Prior to Visit  Medication Sig Dispense Refill   nortriptyline (PAMELOR) 50 MG capsule Take 1 capsule (50 mg total) by mouth at bedtime. 90 capsule 3   No current facility-administered medications on file prior to visit.    ALLERGIES: Allergies  Allergen Reactions   Caffeine Shortness Of Breath and Other (See Comments)    RAPID HEART RATE   Phenergan [Promethazine Hcl] Anxiety and Palpitations   Caffeine Nausea And Vomiting    FAMILY HISTORY: Family History  Problem Relation Age of Onset   Diabetes Mother    High blood pressure Mother    Prostate cancer Maternal Grandfather    Cancer Maternal Grandmother    Colon polyps Neg Hx    Colon cancer Neg Hx    Esophageal cancer Neg Hx    Rectal cancer Neg Hx    Stomach cancer Neg Hx  SOCIAL HISTORY: Social History   Socioeconomic History   Marital status: Significant Other    Spouse name: Not on file   Number of children: 1   Years of education: Not on file   Highest education level: Not on file  Occupational History   Occupation: cook  Tobacco Use   Smoking status: Never   Smokeless tobacco: Never  Vaping Use   Vaping Use: Never used  Substance and Sexual Activity   Alcohol use: Yes    Alcohol/week: 0.0 standard drinks of alcohol   Drug use: Not  Currently    Types: Marijuana   Sexual activity: Not on file  Other Topics Concern   Not on file  Social History Narrative   ** Merged History Encounter **       ** Data from: 08/07/18 Enc Dept: [facility] [place]   ** Merged History Encounter **           ** Data from: 07/07/16 Enc Dept: [facility]   ** Merged History Encounter **      Right handed       Social Determinants of Health   Financial Resource Strain: Not on file  Food Insecurity: Not on file  Transportation Needs: Not on file  Physical Activity: Not on file  Stress: Not on file  Social Connections: Not on file  Intimate Partner Violence: Not on file     PHYSICAL EXAM: Vitals:   08/17/22 0836  BP: (!) 144/80  Pulse: 97  SpO2: 98%   General: No acute distress Head:  Normocephalic/atraumatic Skin/Extremities: No rash, no edema Neurological Exam: alert and awake. No aphasia or dysarthria. Fund of knowledge is appropriate.  Attention and concentration are normal.   Cranial nerves: Pupils equal, round. Extraocular movements intact with no nystagmus. Visual fields full.  No facial asymmetry.  Motor: Bulk and tone normal, muscle strength 5/5 throughout with no pronator drift.   Finger to nose testing intact.  Gait narrow-based and steady, able to tandem walk adequately.  Romberg negative.   IMPRESSION: This is a pleasant 32 yo RH man who presented with chronic daily headaches in 2015. He has had an excellent response to nortriptyline 50mg  qhs, headache-free in the past year. Historically, when he misses a dose or with attempts to wean medication, headaches/dizziness, nausea recur. BP today again elevated, follow-up with PCP. Follow-up in 1 year, call for any changes.   Thank you for allowing me to participate in his care.  Please do not hesitate to call for any questions or concerns.    Patrcia Dolly, M.D.   CC: Dr. Chales Salmon

## 2023-06-16 ENCOUNTER — Other Ambulatory Visit: Payer: Self-pay | Admitting: Internal Medicine

## 2023-06-16 DIAGNOSIS — L729 Follicular cyst of the skin and subcutaneous tissue, unspecified: Secondary | ICD-10-CM

## 2023-07-19 ENCOUNTER — Telehealth: Payer: Self-pay | Admitting: Neurology

## 2023-07-19 MED ORDER — NORTRIPTYLINE HCL 50 MG PO CAPS: 50.0000 mg | ORAL_CAPSULE | Freq: Every day | ORAL | 0 refills | Status: DC

## 2023-07-19 NOTE — Telephone Encounter (Signed)
 Rx PAMELOR needs refill

## 2023-07-19 NOTE — Telephone Encounter (Signed)
 Refill sent in for pt.

## 2023-08-16 ENCOUNTER — Ambulatory Visit (INDEPENDENT_AMBULATORY_CARE_PROVIDER_SITE_OTHER): Payer: Self-pay | Admitting: Neurology

## 2023-08-16 ENCOUNTER — Encounter: Payer: Self-pay | Admitting: Neurology

## 2023-08-16 VITALS — BP 127/78 | HR 94 | Wt 147.4 lb

## 2023-08-16 DIAGNOSIS — G43009 Migraine without aura, not intractable, without status migrainosus: Secondary | ICD-10-CM

## 2023-08-16 MED ORDER — NORTRIPTYLINE HCL 50 MG PO CAPS: 50.0000 mg | ORAL_CAPSULE | Freq: Every day | ORAL | 4 refills | Status: AC

## 2023-08-16 NOTE — Patient Instructions (Addendum)
 Siempre es Actor. Contina tomando Nortriptilina 50 mg todas las noches. Consulta con un oftalmlogo si los sntomas persisten. Consulta dentro de un ao; llama si hay algn cambio.   Always good to see you. Continue Nortriptyline  50mg  every night. Follow-up with eye doctor if eye symptoms continue. Follow-up in 1 year, call for any changes.

## 2023-08-16 NOTE — Progress Notes (Signed)
 NEUROLOGY FOLLOW UP OFFICE NOTE  Ger Paladino [id number] 1990/12/21  HISTORY OF PRESENT ILLNESS: I had the pleasure of seeing Howard Bender in follow-up in the neurology clinic on 08/16/2023.  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 He is again accompanied by his friend who helps supplement the history and provide Spanish interpretation today. They have declined a medical interpreter.   Records and images were personally reviewed where available.  Since his last visit, he reports doing well. He has not been having any headaches or dizziness, no side effects on Nortriptyline  50mg  at bedtime. Prior attempts to wean nortriptyline  caused recurrence of symptoms. He reports 2 weeks ago he had twitching of the right eyelid, this has not recurred. He denies any vision changes, focal numbness/tingling/weakness, no falls. Sleep and mood are good.    HPI:  This is a pleasant 33 yo RH man with no significant past medical history in his usually state of health until the past year when he started having left temporal, bilateral retro-orbital and periorbital (above the eyelids) sharp pain with associated photosensitivity. He reports that pain usually occurs when he walks around, he feels better sitting down and after eating. He feels that symptoms started when he began using his cellphone, with his head bent forward. Extending his head back seemed to help. He reported pain occurs daily, with rarely a day without pain. 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 When the pain first started, he was having associated nausea, which was relieved with Zofran . He has stopped taking this. He also started having dizziness described as a brief spinning sensation with head movement, resolving when head is still. He also had black floaters in his vision. He had seen an optometrist twice and was told vision was okay.  He had seen ENT, evaluation unremarkable, and symptoms felt to be due to migraine. He took 2 tablets of Excedrin  migraine one time, however had side effects of whole body numbness, leading to an visit to [facility] ER last 08/2013 where head CT done which I personally reviewed was unremarkable. He initially reported improvement in symptoms with nortriptyline  30mg  qhs. 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 There is no family history of headaches.  PAST MEDICAL HISTORY: Past Medical History:  Diagnosis Date   Anxiety    Hypertension one year   resolved   Migraines    sees neurologist    MEDICATIONS: Current Outpatient Medications on File Prior to Visit  Medication Sig Dispense Refill   nortriptyline  (PAMELOR ) 50 MG capsule Take 1 capsule (50 mg total) by mouth at bedtime. 30 capsule 0   No current facility-administered medications on file prior to visit.    ALLERGIES: Allergies  Allergen Reactions   Caffeine  Shortness Of Breath and Other (See Comments)    RAPID HEART RATE   Phenergan  [Promethazine  Hcl] Anxiety and Palpitations   Caffeine  Nausea And Vomiting    FAMILY HISTORY: Family History  Problem Relation Age of Onset   Diabetes Mother    High blood pressure Mother    Prostate cancer Maternal Grandfather    Cancer Maternal Grandmother    Colon polyps Neg Hx    Colon cancer Neg Hx    Esophageal cancer Neg Hx    Rectal cancer Neg Hx    Stomach cancer Neg Hx  SOCIAL HISTORY: Social History   Socioeconomic History   Marital status: Significant Other    Spouse name: Not on file   Number of children: 1   Years of education: Not on file   Highest education level: Not on file  Occupational History   Occupation: cook  Tobacco Use   Smoking status: Never   Smokeless tobacco: Never  Vaping Use   Vaping status: Never Used  Substance and Sexual Activity   Alcohol use: Yes    Alcohol/week: 0.0 standard drinks of alcohol   Drug use:  Not Currently    Types: Marijuana   Sexual activity: Not on file  Other Topics Concern   Not on file  Social History Narrative   ** Merged History Encounter **       ** Data from: 08/07/18 Enc Dept: [facility]   ** Merged History Encounter **           ** Data from: 07/07/16 Enc Dept: [facility]   ** Merged History Encounter **      Right handed       Social Drivers of Health   Financial Resource Strain: Not on file  Food Insecurity: Not on file  Transportation Needs: Not on file  Physical Activity: Not on file  Stress: Not on file  Social Connections: Not on file  Intimate Partner Violence: Not on file     PHYSICAL EXAM: Vitals:   08/16/23 0832  BP: 127/78  Pulse: 94  SpO2: 99%   General: No acute distress Head:  Normocephalic/atraumatic Skin/Extremities: No rash, no edema Neurological Exam: alert and awake. No aphasia or dysarthria. Fund of knowledge is appropriate. Attention and concentration are normal.   Cranial nerves: Pupils equal, round. Extraocular movements intact with no nystagmus. Visual fields full.  No facial asymmetry.  Motor: Bulk and tone normal, muscle strength 5/5 throughout with no pronator drift.   Finger to nose testing intact.  Gait narrow-based and steady, able to tandem walk adequately.  Romberg negative.   IMPRESSION: This is a pleasant 33 yo RH man who presented with chronic daily headaches in 2015. He has had an excellent response to nortriptyline  50mg  at bedtime, prior attempts to wean off would cause symptom recurrence. He denies any headaches or dizziness in the past year. Refills sent. Follow-up in 1 year, call for any changes.    Thank you for allowing me to participate in his care.  Please do not hesitate to call for any questions or concerns.    Rayfield Cairo, M.D.   CC: Dr. Varadarajan

## 2024-03-01 ENCOUNTER — Encounter: Payer: Self-pay | Admitting: Neurology

## 2024-08-15 ENCOUNTER — Ambulatory Visit: Payer: Self-pay | Admitting: Neurology
